# Patient Record
Sex: Female | Born: 1940 | Race: White | Hispanic: No | Marital: Single | Smoking: Former smoker
Health system: Southern US, Community
[De-identification: ages and names within clinical notes are randomized; demographics above are authoritative.]

## PROBLEM LIST (undated history)

## (undated) DIAGNOSIS — Z9889 Other specified postprocedural states: Secondary | ICD-10-CM

## (undated) DIAGNOSIS — T8859XA Other complications of anesthesia, initial encounter: Secondary | ICD-10-CM

## (undated) DIAGNOSIS — T4145XA Adverse effect of unspecified anesthetic, initial encounter: Secondary | ICD-10-CM

## (undated) DIAGNOSIS — E0590 Thyrotoxicosis, unspecified without thyrotoxic crisis or storm: Secondary | ICD-10-CM

## (undated) DIAGNOSIS — R112 Nausea with vomiting, unspecified: Secondary | ICD-10-CM

## (undated) DIAGNOSIS — Z87442 Personal history of urinary calculi: Secondary | ICD-10-CM

## (undated) DIAGNOSIS — M1990 Unspecified osteoarthritis, unspecified site: Secondary | ICD-10-CM

## (undated) DIAGNOSIS — I10 Essential (primary) hypertension: Secondary | ICD-10-CM

## (undated) DIAGNOSIS — R51 Headache: Secondary | ICD-10-CM

## (undated) HISTORY — DX: Unspecified osteoarthritis, unspecified site: M19.90

## (undated) HISTORY — PX: TONSILLECTOMY: SUR1361

---

## 2014-06-06 ENCOUNTER — Other Ambulatory Visit: Payer: Self-pay | Admitting: Geriatric Medicine

## 2014-06-06 DIAGNOSIS — Z1231 Encounter for screening mammogram for malignant neoplasm of breast: Secondary | ICD-10-CM

## 2014-06-12 ENCOUNTER — Ambulatory Visit
Admission: RE | Admit: 2014-06-12 | Discharge: 2014-06-12 | Disposition: A | Discharge status: Home / Self Care | Payer: BC Managed Care – PPO | Source: Ambulatory Visit | Attending: Geriatric Medicine | Admitting: Geriatric Medicine

## 2014-06-12 DIAGNOSIS — Z1231 Encounter for screening mammogram for malignant neoplasm of breast: Secondary | ICD-10-CM

## 2014-07-06 ENCOUNTER — Other Ambulatory Visit: Payer: Self-pay | Admitting: Gastroenterology

## 2014-07-12 ENCOUNTER — Encounter (HOSPITAL_COMMUNITY): Payer: Self-pay | Admitting: Pharmacy Technician

## 2014-07-20 ENCOUNTER — Encounter (HOSPITAL_COMMUNITY): Payer: Self-pay | Admitting: *Deleted

## 2014-07-31 ENCOUNTER — Encounter (HOSPITAL_COMMUNITY): Payer: BC Managed Care – PPO | Admitting: Anesthesiology

## 2014-07-31 ENCOUNTER — Encounter (HOSPITAL_COMMUNITY): Payer: Self-pay | Admitting: *Deleted

## 2014-07-31 ENCOUNTER — Encounter (HOSPITAL_COMMUNITY)
Admission: RE | Disposition: A | Discharge status: Home / Self Care | Payer: Self-pay | Source: Ambulatory Visit | Attending: Gastroenterology

## 2014-07-31 ENCOUNTER — Ambulatory Visit (HOSPITAL_COMMUNITY)
Admission: RE | Admit: 2014-07-31 | Discharge: 2014-07-31 | Disposition: A | Discharge status: Home / Self Care | Payer: BC Managed Care – PPO | Source: Ambulatory Visit | Attending: Gastroenterology | Admitting: Gastroenterology

## 2014-07-31 ENCOUNTER — Ambulatory Visit (HOSPITAL_COMMUNITY): Payer: BC Managed Care – PPO | Admitting: Anesthesiology

## 2014-07-31 DIAGNOSIS — Z87891 Personal history of nicotine dependence: Secondary | ICD-10-CM | POA: Diagnosis not present

## 2014-07-31 DIAGNOSIS — K5730 Diverticulosis of large intestine without perforation or abscess without bleeding: Secondary | ICD-10-CM | POA: Insufficient documentation

## 2014-07-31 DIAGNOSIS — E890 Postprocedural hypothyroidism: Secondary | ICD-10-CM | POA: Diagnosis not present

## 2014-07-31 DIAGNOSIS — I10 Essential (primary) hypertension: Secondary | ICD-10-CM | POA: Diagnosis not present

## 2014-07-31 DIAGNOSIS — M899 Disorder of bone, unspecified: Secondary | ICD-10-CM | POA: Insufficient documentation

## 2014-07-31 DIAGNOSIS — E7800 Pure hypercholesterolemia, unspecified: Secondary | ICD-10-CM | POA: Insufficient documentation

## 2014-07-31 DIAGNOSIS — E0500 Thyrotoxicosis with diffuse goiter without thyrotoxic crisis or storm: Secondary | ICD-10-CM | POA: Insufficient documentation

## 2014-07-31 DIAGNOSIS — Z1211 Encounter for screening for malignant neoplasm of colon: Secondary | ICD-10-CM | POA: Diagnosis not present

## 2014-07-31 DIAGNOSIS — M949 Disorder of cartilage, unspecified: Secondary | ICD-10-CM

## 2014-07-31 HISTORY — DX: Personal history of urinary calculi: Z87.442

## 2014-07-31 HISTORY — DX: Headache: R51

## 2014-07-31 HISTORY — DX: Essential (primary) hypertension: I10

## 2014-07-31 HISTORY — DX: Other specified postprocedural states: R11.2

## 2014-07-31 HISTORY — DX: Thyrotoxicosis, unspecified without thyrotoxic crisis or storm: E05.90

## 2014-07-31 HISTORY — DX: Other complications of anesthesia, initial encounter: T88.59XA

## 2014-07-31 HISTORY — DX: Other specified postprocedural states: Z98.890

## 2014-07-31 HISTORY — DX: Adverse effect of unspecified anesthetic, initial encounter: T41.45XA

## 2014-07-31 HISTORY — PX: COLONOSCOPY WITH PROPOFOL: SHX5780

## 2014-07-31 SURGERY — COLONOSCOPY WITH PROPOFOL
Anesthesia: Monitor Anesthesia Care

## 2014-07-31 MED ORDER — ONDANSETRON HCL 4 MG/2ML IJ SOLN
INTRAMUSCULAR | Status: DC | PRN
  Administered 2014-07-31: 4 mg via INTRAVENOUS

## 2014-07-31 MED ORDER — ONDANSETRON HCL 4 MG/2ML IJ SOLN
INTRAMUSCULAR | Status: AC
  Filled 2014-07-31: qty 2

## 2014-07-31 MED ORDER — PROPOFOL 10 MG/ML IV BOLUS
INTRAVENOUS | Status: AC
  Filled 2014-07-31: qty 20

## 2014-07-31 MED ORDER — DEXAMETHASONE SODIUM PHOSPHATE 10 MG/ML IJ SOLN
INTRAMUSCULAR | Status: AC
  Filled 2014-07-31: qty 1

## 2014-07-31 MED ORDER — LIDOCAINE HCL (CARDIAC) 20 MG/ML IV SOLN
INTRAVENOUS | Status: DC | PRN
  Administered 2014-07-31: 75 mg via INTRAVENOUS

## 2014-07-31 MED ORDER — SODIUM CHLORIDE 0.9 % IV SOLN: INTRAVENOUS | Status: DC

## 2014-07-31 MED ORDER — DEXAMETHASONE SODIUM PHOSPHATE 10 MG/ML IJ SOLN
INTRAMUSCULAR | Status: DC | PRN
  Administered 2014-07-31: 5 mg via INTRAVENOUS

## 2014-07-31 MED ORDER — LACTATED RINGERS IV SOLN
INTRAVENOUS | Status: DC
  Administered 2014-07-31: 1000 mL via INTRAVENOUS

## 2014-07-31 MED ORDER — PROPOFOL INFUSION 10 MG/ML OPTIME
INTRAVENOUS | Status: DC | PRN
  Administered 2014-07-31: 140 ug/kg/min via INTRAVENOUS

## 2014-07-31 MED ORDER — PROPOFOL 10 MG/ML IV BOLUS
INTRAVENOUS | Status: DC | PRN
  Administered 2014-07-31 (×3): 20 mg via INTRAVENOUS
  Administered 2014-07-31: 30 mg via INTRAVENOUS

## 2014-07-31 MED ORDER — LIDOCAINE HCL (CARDIAC) 20 MG/ML IV SOLN
INTRAVENOUS | Status: AC
  Filled 2014-07-31: qty 5

## 2014-07-31 SURGICAL SUPPLY — 21 items

## 2014-07-31 NOTE — Transfer of Care (Signed)
Immediate Anesthesia Transfer of Care Note  Patient: Kerry Wright  Procedure(s) Performed: Procedure(s): COLONOSCOPY WITH PROPOFOL (N/A)  Patient Location: PACU and Endoscopy Unit  Anesthesia Type:MAC  Level of Consciousness: awake, alert  and oriented  Airway & Oxygen Therapy: Patient Spontanous Breathing and Patient connected to face mask oxygen  Post-op Assessment: Report given to PACU RN, Post -op Vital signs reviewed and stable and Patient moving all extremities X 4  Post vital signs: Reviewed and stable  Complications: No apparent anesthesia complications

## 2014-07-31 NOTE — H&P (Signed)
  Procedure: Baseline screening colonoscopy  History: The patient is a 73 year old female born [birth year]. She is scheduled to undergo her first screening colonoscopy with polypectomy to prevent colon cancer.  Past medical and surgical history: Tonsillectomy. Graves' disease. Hypothyroidism post radioactive iodine therapy to the thyroid. Kidney stones. Hypercholesterolemia. Hypertension. Osteopenia.  Allergies: Sulfa. Penicillin. IV contrast dye.  Exam: The patient is alert and lying comfortably on the endoscopy stretcher. Lungs are clear to auscultation. Cardiac exam reveals a regular rhythm. Abdomen is soft and nontender to palpation  Plan: Proceed with baseline screening colonoscopy

## 2014-07-31 NOTE — Anesthesia Postprocedure Evaluation (Signed)
Anesthesia Post Note  Patient: Kerry Wright  Procedure(s) Performed: Procedure(s) (LRB): COLONOSCOPY WITH PROPOFOL (N/A)  Anesthesia type: MAC  Patient location: PACU  Post pain: Pain level controlled  Post assessment: Post-op Vital signs reviewed  Last Vitals: BP 154/73  Pulse 75  Temp(Src) 36.7 C (Oral)  Resp 13  Ht 5' (1.524 m)  Wt 230 lb (104.327 kg)  BMI 44.92 kg/m2  SpO2 100%  Post vital signs: Reviewed  Level of consciousness: awake  Complications: No apparent anesthesia complications

## 2014-07-31 NOTE — Op Note (Signed)
Procedure: Baseline screening colonoscopy. Negative family history for colon cancer.  Endoscopist: Danise Edge  Premedication: Propofol administered by anesthesia  Procedure: The patient was placed in the left lateral decubitus position. Anal inspection and digital rectal exam were normal. The Pentax pediatric colonoscope was introduced into the rectum and advanced to the cecum. A normal-appearing appendiceal orifice was identified. A normal-appearing ileocecal valve was identified. Colonic preparation for the exam today was good.  Rectum. Normal. Retroflexed view of the distal rectum normal  Sigmoid colon and descending colon. Left colonic diverticulosis  Splenic flexure. Normal  Transverse colon. Normal  Hepatic flexure. Normal  Ascending colon. Normal  Cecum and ileocecal valve. Normal  Assessment: Normal baseline screening colonoscopy.

## 2014-07-31 NOTE — Discharge Instructions (Addendum)
Colonoscopy, Care After °Refer to this sheet in the next few weeks. These instructions provide you with information on caring for yourself after your procedure. Your health care provider may also give you more specific instructions. Your treatment has been planned according to current medical practices, but problems sometimes occur. Call your health care provider if you have any problems or questions after your procedure. °WHAT TO EXPECT AFTER THE PROCEDURE  °After your procedure, it is typical to have the following: °· A small amount of blood in your stool. °· Moderate amounts of gas and mild abdominal cramping or bloating. °HOME CARE INSTRUCTIONS °· Do not drive, operate machinery, or sign important documents for 24 hours. °· You may shower and resume your regular physical activities, but move at a slower pace for the first 24 hours. °· Take frequent rest periods for the first 24 hours. °· Walk around or put a warm pack on your abdomen to help reduce abdominal cramping and bloating. °· Drink enough fluids to keep your urine clear or pale yellow. °· You may resume your normal diet as instructed by your health care provider. Avoid heavy or fried foods that are hard to digest. °· Avoid drinking alcohol for 24 hours or as instructed by your health care provider. °· Only take over-the-counter or prescription medicines as directed by your health care provider. °· If a tissue sample (biopsy) was taken during your procedure: °¨ Do not take aspirin or blood thinners for 7 days, or as instructed by your health care provider. °¨ Do not drink alcohol for 7 days, or as instructed by your health care provider. °¨ Eat soft foods for the first 24 hours. °SEEK MEDICAL CARE IF: °You have persistent spotting of blood in your stool 2-3 days after the procedure. °SEEK IMMEDIATE MEDICAL CARE IF: °· You have more than a small spotting of blood in your stool. °· You pass large blood clots in your stool. °· Your abdomen is swollen  (distended). °· You have nausea or vomiting. °· You have a fever. °· You have increasing abdominal pain that is not relieved with medicine. °Document Released: 06/17/2004 Document Revised: 08/24/2013 Document Reviewed: 07/11/2013 °Exit[employer]® Patient Information ©2015 [employer]. This information is not intended to replace advice given to you by your health care provider. Make sure you discuss any questions you have with your health care provider. ° °Colonoscopy, Care After °Refer to this sheet in the next few weeks. These instructions provide you with information on caring for yourself after your procedure. Your health care provider may also give you more specific instructions. Your treatment has been planned according to current medical practices, but problems sometimes occur. Call your health care provider if you have any problems or questions after your procedure. °WHAT TO EXPECT AFTER THE PROCEDURE  °After your procedure, it is typical to have the following: °· A small amount of blood in your stool. °· Moderate amounts of gas and mild abdominal cramping or bloating. °HOME CARE INSTRUCTIONS °· Do not drive, operate machinery, or sign important documents for 24 hours. °· You may shower and resume your regular physical activities, but move at a slower pace for the first 24 hours. °· Take frequent rest periods for the first 24 hours. °· Walk around or put a warm pack on your abdomen to help reduce abdominal cramping and bloating. °· Drink enough fluids to keep your urine clear or pale yellow. °· You may resume your normal diet as instructed by your health care provider.   Avoid heavy or fried foods that are hard to digest. °· Avoid drinking alcohol for 24 hours or as instructed by your health care provider. °· Only take over-the-counter or prescription medicines as directed by your health care provider. °· If a tissue sample (biopsy) was taken during your procedure: °¨ Do not take aspirin or blood thinners for 7  days, or as instructed by your health care provider. °¨ Do not drink alcohol for 7 days, or as instructed by your health care provider. °¨ Eat soft foods for the first 24 hours. °SEEK MEDICAL CARE IF: °You have persistent spotting of blood in your stool 2-3 days after the procedure. °SEEK IMMEDIATE MEDICAL CARE IF: °· You have more than a small spotting of blood in your stool. °· You pass large blood clots in your stool. °· Your abdomen is swollen (distended). °· You have nausea or vomiting. °· You have a fever. °· You have increasing abdominal pain that is not relieved with medicine. °Document Released: 06/17/2004 Document Revised: 08/24/2013 Document Reviewed: 07/11/2013 °Exit[employer]® Patient Information ©2015 [employer]. This information is not intended to replace advice given to you by your health care provider. Make sure you discuss any questions you have with your health care provider. ° °

## 2014-07-31 NOTE — Anesthesia Preprocedure Evaluation (Signed)
Anesthesia Evaluation  Patient identified by MRN, date of birth, ID band Patient awake    Reviewed: Allergy & Precautions, H&P , NPO status , Patient's Chart, lab work & pertinent test results  History of Anesthesia Complications (+) PONV and history of anesthetic complications  Airway Mallampati: II TM Distance: >3 FB Neck ROM: Full    Dental no notable dental hx.    Pulmonary former smoker,  breath sounds clear to auscultation  Pulmonary exam normal       Cardiovascular hypertension, Pt. on home beta blockers and Pt. on medications Rhythm:Regular Rate:Normal     Neuro/Psych  Headaches, negative psych ROS   GI/Hepatic negative GI ROS, Neg liver ROS,   Endo/Other  Hyperthyroidism   Renal/GU negative Renal ROS     Musculoskeletal negative musculoskeletal ROS (+)   Abdominal   Peds  Hematology negative hematology ROS (+)   Anesthesia Other Findings   Reproductive/Obstetrics negative OB ROS                           Anesthesia Physical Anesthesia Plan  ASA: II  Anesthesia Plan: MAC   Post-op Pain Management:    Induction: Intravenous  Airway Management Planned:   Additional Equipment:   Intra-op Plan:   Post-operative Plan:   Informed Consent: I have reviewed the patients History and Physical, chart, labs and discussed the procedure including the risks, benefits and alternatives for the proposed anesthesia with the patient or authorized representative who has indicated his/her understanding and acceptance.   Dental advisory given  Plan Discussed with: CRNA  Anesthesia Plan Comments:         Anesthesia Quick Evaluation

## 2014-08-01 ENCOUNTER — Encounter (HOSPITAL_COMMUNITY): Payer: Self-pay | Admitting: Gastroenterology

## 2015-10-24 ENCOUNTER — Other Ambulatory Visit: Payer: Self-pay | Admitting: Geriatric Medicine

## 2015-10-24 ENCOUNTER — Other Ambulatory Visit: Payer: Self-pay

## 2015-10-24 DIAGNOSIS — Z1231 Encounter for screening mammogram for malignant neoplasm of breast: Secondary | ICD-10-CM

## 2015-11-13 ENCOUNTER — Ambulatory Visit
Admission: RE | Admit: 2015-11-13 | Discharge: 2015-11-13 | Disposition: A | Discharge status: Home / Self Care | Payer: BLUE CROSS/BLUE SHIELD | Source: Ambulatory Visit | Attending: Geriatric Medicine | Admitting: Geriatric Medicine

## 2015-11-13 DIAGNOSIS — Z1231 Encounter for screening mammogram for malignant neoplasm of breast: Secondary | ICD-10-CM

## 2017-12-03 DIAGNOSIS — J209 Acute bronchitis, unspecified: Secondary | ICD-10-CM | POA: Diagnosis not present

## 2017-12-09 ENCOUNTER — Other Ambulatory Visit: Payer: Self-pay | Admitting: Geriatric Medicine

## 2017-12-09 DIAGNOSIS — Z79899 Other long term (current) drug therapy: Secondary | ICD-10-CM | POA: Diagnosis not present

## 2017-12-09 DIAGNOSIS — Z1231 Encounter for screening mammogram for malignant neoplasm of breast: Secondary | ICD-10-CM

## 2017-12-09 DIAGNOSIS — Z23 Encounter for immunization: Secondary | ICD-10-CM | POA: Diagnosis not present

## 2017-12-09 DIAGNOSIS — I10 Essential (primary) hypertension: Secondary | ICD-10-CM | POA: Diagnosis not present

## 2017-12-09 DIAGNOSIS — E7800 Pure hypercholesterolemia, unspecified: Secondary | ICD-10-CM | POA: Diagnosis not present

## 2017-12-09 DIAGNOSIS — E039 Hypothyroidism, unspecified: Secondary | ICD-10-CM | POA: Diagnosis not present

## 2017-12-09 DIAGNOSIS — E559 Vitamin D deficiency, unspecified: Secondary | ICD-10-CM | POA: Diagnosis not present

## 2017-12-09 DIAGNOSIS — Z0000 Encounter for general adult medical examination without abnormal findings: Secondary | ICD-10-CM | POA: Diagnosis not present

## 2017-12-14 DIAGNOSIS — E890 Postprocedural hypothyroidism: Secondary | ICD-10-CM | POA: Diagnosis not present

## 2017-12-14 DIAGNOSIS — E0500 Thyrotoxicosis with diffuse goiter without thyrotoxic crisis or storm: Secondary | ICD-10-CM | POA: Diagnosis not present

## 2017-12-14 DIAGNOSIS — M8580 Other specified disorders of bone density and structure, unspecified site: Secondary | ICD-10-CM | POA: Diagnosis not present

## 2017-12-14 DIAGNOSIS — R7303 Prediabetes: Secondary | ICD-10-CM | POA: Diagnosis not present

## 2017-12-29 ENCOUNTER — Ambulatory Visit
Admission: RE | Admit: 2017-12-29 | Discharge: 2017-12-29 | Disposition: A | Discharge status: Home / Self Care | Payer: BLUE CROSS/BLUE SHIELD | Source: Ambulatory Visit | Attending: Geriatric Medicine | Admitting: Geriatric Medicine

## 2017-12-29 DIAGNOSIS — Z1231 Encounter for screening mammogram for malignant neoplasm of breast: Secondary | ICD-10-CM

## 2018-06-16 DIAGNOSIS — H8113 Benign paroxysmal vertigo, bilateral: Secondary | ICD-10-CM | POA: Diagnosis not present

## 2018-06-16 DIAGNOSIS — I10 Essential (primary) hypertension: Secondary | ICD-10-CM | POA: Diagnosis not present

## 2018-06-16 DIAGNOSIS — R7303 Prediabetes: Secondary | ICD-10-CM | POA: Diagnosis not present

## 2018-06-16 DIAGNOSIS — M25551 Pain in right hip: Secondary | ICD-10-CM | POA: Diagnosis not present

## 2018-06-22 DIAGNOSIS — M1611 Unilateral primary osteoarthritis, right hip: Secondary | ICD-10-CM | POA: Diagnosis not present

## 2018-06-22 DIAGNOSIS — M47816 Spondylosis without myelopathy or radiculopathy, lumbar region: Secondary | ICD-10-CM | POA: Diagnosis not present

## 2018-06-29 DIAGNOSIS — M1611 Unilateral primary osteoarthritis, right hip: Secondary | ICD-10-CM | POA: Diagnosis not present

## 2019-01-17 DIAGNOSIS — E890 Postprocedural hypothyroidism: Secondary | ICD-10-CM | POA: Diagnosis not present

## 2019-01-17 DIAGNOSIS — R7303 Prediabetes: Secondary | ICD-10-CM | POA: Diagnosis not present

## 2019-01-21 DIAGNOSIS — Z79899 Other long term (current) drug therapy: Secondary | ICD-10-CM | POA: Diagnosis not present

## 2019-01-21 DIAGNOSIS — Z0000 Encounter for general adult medical examination without abnormal findings: Secondary | ICD-10-CM | POA: Diagnosis not present

## 2019-01-21 DIAGNOSIS — E7800 Pure hypercholesterolemia, unspecified: Secondary | ICD-10-CM | POA: Diagnosis not present

## 2019-02-02 DIAGNOSIS — M8580 Other specified disorders of bone density and structure, unspecified site: Secondary | ICD-10-CM | POA: Diagnosis not present

## 2019-02-02 DIAGNOSIS — E890 Postprocedural hypothyroidism: Secondary | ICD-10-CM | POA: Diagnosis not present

## 2019-02-02 DIAGNOSIS — E0500 Thyrotoxicosis with diffuse goiter without thyrotoxic crisis or storm: Secondary | ICD-10-CM | POA: Diagnosis not present

## 2019-02-02 DIAGNOSIS — R7303 Prediabetes: Secondary | ICD-10-CM | POA: Diagnosis not present

## 2019-04-29 ENCOUNTER — Other Ambulatory Visit: Payer: Self-pay | Admitting: Internal Medicine

## 2019-04-29 DIAGNOSIS — R5381 Other malaise: Secondary | ICD-10-CM

## 2019-05-03 ENCOUNTER — Other Ambulatory Visit: Payer: Self-pay | Admitting: Internal Medicine

## 2019-05-03 DIAGNOSIS — M810 Age-related osteoporosis without current pathological fracture: Secondary | ICD-10-CM

## 2019-07-18 ENCOUNTER — Other Ambulatory Visit: Payer: BLUE CROSS/BLUE SHIELD

## 2019-09-06 ENCOUNTER — Other Ambulatory Visit: Payer: Self-pay | Admitting: Geriatric Medicine

## 2019-09-06 DIAGNOSIS — Z1231 Encounter for screening mammogram for malignant neoplasm of breast: Secondary | ICD-10-CM

## 2019-10-25 DIAGNOSIS — E039 Hypothyroidism, unspecified: Secondary | ICD-10-CM | POA: Diagnosis not present

## 2019-10-25 DIAGNOSIS — I10 Essential (primary) hypertension: Secondary | ICD-10-CM | POA: Diagnosis not present

## 2019-10-25 DIAGNOSIS — Z87891 Personal history of nicotine dependence: Secondary | ICD-10-CM | POA: Diagnosis not present

## 2019-10-25 DIAGNOSIS — E785 Hyperlipidemia, unspecified: Secondary | ICD-10-CM | POA: Diagnosis not present

## 2019-10-25 DIAGNOSIS — I739 Peripheral vascular disease, unspecified: Secondary | ICD-10-CM | POA: Diagnosis not present

## 2019-10-25 DIAGNOSIS — Z880 Allergy status to penicillin: Secondary | ICD-10-CM | POA: Diagnosis not present

## 2019-10-25 DIAGNOSIS — J309 Allergic rhinitis, unspecified: Secondary | ICD-10-CM | POA: Diagnosis not present

## 2019-10-25 DIAGNOSIS — Z882 Allergy status to sulfonamides status: Secondary | ICD-10-CM | POA: Diagnosis not present

## 2019-10-25 DIAGNOSIS — Z888 Allergy status to other drugs, medicaments and biological substances status: Secondary | ICD-10-CM | POA: Diagnosis not present

## 2019-12-01 ENCOUNTER — Other Ambulatory Visit: Payer: Self-pay

## 2019-12-01 ENCOUNTER — Ambulatory Visit
Admission: RE | Admit: 2019-12-01 | Discharge: 2019-12-01 | Disposition: A | Discharge status: Home / Self Care | Payer: Medicare HMO | Source: Ambulatory Visit | Attending: Internal Medicine | Admitting: Internal Medicine

## 2019-12-01 ENCOUNTER — Ambulatory Visit
Admission: RE | Admit: 2019-12-01 | Discharge: 2019-12-01 | Disposition: A | Discharge status: Home / Self Care | Payer: Medicare HMO | Source: Ambulatory Visit | Attending: Geriatric Medicine | Admitting: Geriatric Medicine

## 2019-12-01 DIAGNOSIS — Z780 Asymptomatic menopausal state: Secondary | ICD-10-CM | POA: Diagnosis not present

## 2019-12-01 DIAGNOSIS — M810 Age-related osteoporosis without current pathological fracture: Secondary | ICD-10-CM

## 2019-12-01 DIAGNOSIS — Z1231 Encounter for screening mammogram for malignant neoplasm of breast: Secondary | ICD-10-CM

## 2019-12-01 DIAGNOSIS — M85832 Other specified disorders of bone density and structure, left forearm: Secondary | ICD-10-CM | POA: Diagnosis not present

## 2019-12-07 DIAGNOSIS — R69 Illness, unspecified: Secondary | ICD-10-CM | POA: Diagnosis not present

## 2019-12-09 ENCOUNTER — Ambulatory Visit: Payer: BLUE CROSS/BLUE SHIELD | Attending: Internal Medicine

## 2019-12-09 ENCOUNTER — Other Ambulatory Visit: Payer: Self-pay

## 2019-12-09 DIAGNOSIS — Z23 Encounter for immunization: Secondary | ICD-10-CM | POA: Insufficient documentation

## 2019-12-09 NOTE — Progress Notes (Signed)
   Covid-19 Vaccination Clinic  Name:  Kerry Wright    MRN: [id number] DOB: 23-Jan-1941  12/09/2019  Kerry Wright was observed post Covid-19 immunization for 30 minutes based on pre-vaccination screening without incidence. She was provided with Vaccine Information Sheet and instruction to access the V-Safe system.   Kerry Wright was instructed to call 911 with any severe reactions post vaccine: Marland Kitchen Difficulty breathing  . Swelling of your face and throat  . A fast heartbeat  . A bad rash all over your body  . Dizziness and weakness    Immunizations Administered    Name Date Dose VIS Date Route   Pfizer COVID-19 Vaccine 12/09/2019 10:40 AM 0.3 mL 10/28/2019 Intramuscular   Manufacturer: [employer], [employer]   Lot: [id number]   NDC: [address]-1

## 2019-12-28 ENCOUNTER — Ambulatory Visit: Payer: BLUE CROSS/BLUE SHIELD

## 2019-12-31 ENCOUNTER — Ambulatory Visit: Payer: BLUE CROSS/BLUE SHIELD | Attending: Internal Medicine

## 2019-12-31 DIAGNOSIS — Z23 Encounter for immunization: Secondary | ICD-10-CM | POA: Insufficient documentation

## 2019-12-31 NOTE — Progress Notes (Signed)
   Covid-19 Vaccination Clinic  Name:  Kerry Wright    MRN: [id number] DOB: December 04, 1940  12/31/2019  Kerry Wright was observed post Covid-19 immunization for 30 minutes based on pre-vaccination screening without incidence. She was provided with Vaccine Information Sheet and instruction to access the V-Safe system.   Kerry Wright was instructed to call 911 with any severe reactions post vaccine: Marland Kitchen Difficulty breathing  . Swelling of your face and throat  . A fast heartbeat  . A bad rash all over your body  . Dizziness and weakness    Immunizations Administered    Name Date Dose VIS Date Route   Pfizer COVID-19 Vaccine 12/31/2019 10:23 AM 0.3 mL 10/28/2019 Intramuscular   Manufacturer: [employer], [employer]   Lot: [id number]   NDC: [address]-9

## 2020-01-23 DIAGNOSIS — M25551 Pain in right hip: Secondary | ICD-10-CM | POA: Diagnosis not present

## 2020-01-23 DIAGNOSIS — R69 Illness, unspecified: Secondary | ICD-10-CM | POA: Diagnosis not present

## 2020-01-23 DIAGNOSIS — R7309 Other abnormal glucose: Secondary | ICD-10-CM | POA: Diagnosis not present

## 2020-01-23 DIAGNOSIS — I129 Hypertensive chronic kidney disease with stage 1 through stage 4 chronic kidney disease, or unspecified chronic kidney disease: Secondary | ICD-10-CM | POA: Diagnosis not present

## 2020-01-23 DIAGNOSIS — N1831 Chronic kidney disease, stage 3a: Secondary | ICD-10-CM | POA: Diagnosis not present

## 2020-01-23 DIAGNOSIS — M8580 Other specified disorders of bone density and structure, unspecified site: Secondary | ICD-10-CM | POA: Diagnosis not present

## 2020-01-23 DIAGNOSIS — J301 Allergic rhinitis due to pollen: Secondary | ICD-10-CM | POA: Diagnosis not present

## 2020-01-23 DIAGNOSIS — Z0000 Encounter for general adult medical examination without abnormal findings: Secondary | ICD-10-CM | POA: Diagnosis not present

## 2020-01-23 DIAGNOSIS — Z79899 Other long term (current) drug therapy: Secondary | ICD-10-CM | POA: Diagnosis not present

## 2020-01-23 DIAGNOSIS — E039 Hypothyroidism, unspecified: Secondary | ICD-10-CM | POA: Diagnosis not present

## 2020-01-23 DIAGNOSIS — E7800 Pure hypercholesterolemia, unspecified: Secondary | ICD-10-CM | POA: Diagnosis not present

## 2020-01-23 DIAGNOSIS — E890 Postprocedural hypothyroidism: Secondary | ICD-10-CM | POA: Diagnosis not present

## 2020-02-02 DIAGNOSIS — R7303 Prediabetes: Secondary | ICD-10-CM | POA: Diagnosis not present

## 2020-02-02 DIAGNOSIS — E0500 Thyrotoxicosis with diffuse goiter without thyrotoxic crisis or storm: Secondary | ICD-10-CM | POA: Diagnosis not present

## 2020-02-02 DIAGNOSIS — E890 Postprocedural hypothyroidism: Secondary | ICD-10-CM | POA: Diagnosis not present

## 2020-02-02 DIAGNOSIS — M8580 Other specified disorders of bone density and structure, unspecified site: Secondary | ICD-10-CM | POA: Diagnosis not present

## 2020-02-13 DIAGNOSIS — I129 Hypertensive chronic kidney disease with stage 1 through stage 4 chronic kidney disease, or unspecified chronic kidney disease: Secondary | ICD-10-CM | POA: Diagnosis not present

## 2020-02-13 DIAGNOSIS — R69 Illness, unspecified: Secondary | ICD-10-CM | POA: Diagnosis not present

## 2020-02-13 DIAGNOSIS — N1831 Chronic kidney disease, stage 3a: Secondary | ICD-10-CM | POA: Diagnosis not present

## 2020-03-12 DIAGNOSIS — Z79899 Other long term (current) drug therapy: Secondary | ICD-10-CM | POA: Diagnosis not present

## 2020-03-12 DIAGNOSIS — N1831 Chronic kidney disease, stage 3a: Secondary | ICD-10-CM | POA: Diagnosis not present

## 2020-03-12 DIAGNOSIS — I129 Hypertensive chronic kidney disease with stage 1 through stage 4 chronic kidney disease, or unspecified chronic kidney disease: Secondary | ICD-10-CM | POA: Diagnosis not present

## 2020-03-12 DIAGNOSIS — R69 Illness, unspecified: Secondary | ICD-10-CM | POA: Diagnosis not present

## 2020-05-09 DIAGNOSIS — N1831 Chronic kidney disease, stage 3a: Secondary | ICD-10-CM | POA: Diagnosis not present

## 2020-05-09 DIAGNOSIS — M1611 Unilateral primary osteoarthritis, right hip: Secondary | ICD-10-CM | POA: Diagnosis not present

## 2020-05-09 DIAGNOSIS — L821 Other seborrheic keratosis: Secondary | ICD-10-CM | POA: Diagnosis not present

## 2020-05-09 DIAGNOSIS — I129 Hypertensive chronic kidney disease with stage 1 through stage 4 chronic kidney disease, or unspecified chronic kidney disease: Secondary | ICD-10-CM | POA: Diagnosis not present

## 2020-05-09 DIAGNOSIS — Z6839 Body mass index (BMI) 39.0-39.9, adult: Secondary | ICD-10-CM | POA: Diagnosis not present

## 2020-06-20 DIAGNOSIS — R69 Illness, unspecified: Secondary | ICD-10-CM | POA: Diagnosis not present

## 2020-06-25 DIAGNOSIS — E7800 Pure hypercholesterolemia, unspecified: Secondary | ICD-10-CM | POA: Diagnosis not present

## 2020-06-25 DIAGNOSIS — R69 Illness, unspecified: Secondary | ICD-10-CM | POA: Diagnosis not present

## 2020-06-25 DIAGNOSIS — N1831 Chronic kidney disease, stage 3a: Secondary | ICD-10-CM | POA: Diagnosis not present

## 2020-06-25 DIAGNOSIS — E890 Postprocedural hypothyroidism: Secondary | ICD-10-CM | POA: Diagnosis not present

## 2020-06-25 DIAGNOSIS — I129 Hypertensive chronic kidney disease with stage 1 through stage 4 chronic kidney disease, or unspecified chronic kidney disease: Secondary | ICD-10-CM | POA: Diagnosis not present

## 2020-06-25 DIAGNOSIS — E039 Hypothyroidism, unspecified: Secondary | ICD-10-CM | POA: Diagnosis not present

## 2020-06-25 DIAGNOSIS — M1611 Unilateral primary osteoarthritis, right hip: Secondary | ICD-10-CM | POA: Diagnosis not present

## 2020-07-07 DIAGNOSIS — I10 Essential (primary) hypertension: Secondary | ICD-10-CM | POA: Diagnosis not present

## 2020-07-07 DIAGNOSIS — K5900 Constipation, unspecified: Secondary | ICD-10-CM | POA: Diagnosis not present

## 2020-07-07 DIAGNOSIS — Z809 Family history of malignant neoplasm, unspecified: Secondary | ICD-10-CM | POA: Diagnosis not present

## 2020-07-07 DIAGNOSIS — I739 Peripheral vascular disease, unspecified: Secondary | ICD-10-CM | POA: Diagnosis not present

## 2020-07-07 DIAGNOSIS — R69 Illness, unspecified: Secondary | ICD-10-CM | POA: Diagnosis not present

## 2020-07-07 DIAGNOSIS — R42 Dizziness and giddiness: Secondary | ICD-10-CM | POA: Diagnosis not present

## 2020-07-07 DIAGNOSIS — E785 Hyperlipidemia, unspecified: Secondary | ICD-10-CM | POA: Diagnosis not present

## 2020-07-07 DIAGNOSIS — M1990 Unspecified osteoarthritis, unspecified site: Secondary | ICD-10-CM | POA: Diagnosis not present

## 2020-07-07 DIAGNOSIS — E039 Hypothyroidism, unspecified: Secondary | ICD-10-CM | POA: Diagnosis not present

## 2020-08-27 DIAGNOSIS — E7800 Pure hypercholesterolemia, unspecified: Secondary | ICD-10-CM | POA: Diagnosis not present

## 2020-08-27 DIAGNOSIS — R69 Illness, unspecified: Secondary | ICD-10-CM | POA: Diagnosis not present

## 2020-08-27 DIAGNOSIS — E890 Postprocedural hypothyroidism: Secondary | ICD-10-CM | POA: Diagnosis not present

## 2020-08-27 DIAGNOSIS — E039 Hypothyroidism, unspecified: Secondary | ICD-10-CM | POA: Diagnosis not present

## 2020-08-27 DIAGNOSIS — M1611 Unilateral primary osteoarthritis, right hip: Secondary | ICD-10-CM | POA: Diagnosis not present

## 2020-08-27 DIAGNOSIS — I129 Hypertensive chronic kidney disease with stage 1 through stage 4 chronic kidney disease, or unspecified chronic kidney disease: Secondary | ICD-10-CM | POA: Diagnosis not present

## 2020-08-27 DIAGNOSIS — N1831 Chronic kidney disease, stage 3a: Secondary | ICD-10-CM | POA: Diagnosis not present

## 2020-09-18 DIAGNOSIS — R509 Fever, unspecified: Secondary | ICD-10-CM | POA: Diagnosis not present

## 2020-09-18 DIAGNOSIS — Z20822 Contact with and (suspected) exposure to covid-19: Secondary | ICD-10-CM | POA: Diagnosis not present

## 2020-10-08 DIAGNOSIS — Z23 Encounter for immunization: Secondary | ICD-10-CM | POA: Diagnosis not present

## 2020-10-08 DIAGNOSIS — N1831 Chronic kidney disease, stage 3a: Secondary | ICD-10-CM | POA: Diagnosis not present

## 2020-10-08 DIAGNOSIS — I129 Hypertensive chronic kidney disease with stage 1 through stage 4 chronic kidney disease, or unspecified chronic kidney disease: Secondary | ICD-10-CM | POA: Diagnosis not present

## 2020-12-24 DIAGNOSIS — E7800 Pure hypercholesterolemia, unspecified: Secondary | ICD-10-CM | POA: Diagnosis not present

## 2020-12-24 DIAGNOSIS — E890 Postprocedural hypothyroidism: Secondary | ICD-10-CM | POA: Diagnosis not present

## 2020-12-24 DIAGNOSIS — N1831 Chronic kidney disease, stage 3a: Secondary | ICD-10-CM | POA: Diagnosis not present

## 2020-12-24 DIAGNOSIS — R69 Illness, unspecified: Secondary | ICD-10-CM | POA: Diagnosis not present

## 2020-12-24 DIAGNOSIS — E039 Hypothyroidism, unspecified: Secondary | ICD-10-CM | POA: Diagnosis not present

## 2020-12-24 DIAGNOSIS — I129 Hypertensive chronic kidney disease with stage 1 through stage 4 chronic kidney disease, or unspecified chronic kidney disease: Secondary | ICD-10-CM | POA: Diagnosis not present

## 2020-12-24 DIAGNOSIS — M1611 Unilateral primary osteoarthritis, right hip: Secondary | ICD-10-CM | POA: Diagnosis not present

## 2021-01-23 DIAGNOSIS — Z0000 Encounter for general adult medical examination without abnormal findings: Secondary | ICD-10-CM | POA: Diagnosis not present

## 2021-01-23 DIAGNOSIS — I129 Hypertensive chronic kidney disease with stage 1 through stage 4 chronic kidney disease, or unspecified chronic kidney disease: Secondary | ICD-10-CM | POA: Diagnosis not present

## 2021-01-23 DIAGNOSIS — R7303 Prediabetes: Secondary | ICD-10-CM | POA: Diagnosis not present

## 2021-01-23 DIAGNOSIS — H25093 Other age-related incipient cataract, bilateral: Secondary | ICD-10-CM | POA: Diagnosis not present

## 2021-01-23 DIAGNOSIS — E890 Postprocedural hypothyroidism: Secondary | ICD-10-CM | POA: Diagnosis not present

## 2021-01-23 DIAGNOSIS — Z1389 Encounter for screening for other disorder: Secondary | ICD-10-CM | POA: Diagnosis not present

## 2021-01-23 DIAGNOSIS — Z1331 Encounter for screening for depression: Secondary | ICD-10-CM | POA: Diagnosis not present

## 2021-01-23 DIAGNOSIS — E7800 Pure hypercholesterolemia, unspecified: Secondary | ICD-10-CM | POA: Diagnosis not present

## 2021-01-23 DIAGNOSIS — K5901 Slow transit constipation: Secondary | ICD-10-CM | POA: Diagnosis not present

## 2021-01-23 DIAGNOSIS — Z79899 Other long term (current) drug therapy: Secondary | ICD-10-CM | POA: Diagnosis not present

## 2021-01-23 DIAGNOSIS — E0500 Thyrotoxicosis with diffuse goiter without thyrotoxic crisis or storm: Secondary | ICD-10-CM | POA: Diagnosis not present

## 2021-02-04 DIAGNOSIS — E890 Postprocedural hypothyroidism: Secondary | ICD-10-CM | POA: Diagnosis not present

## 2021-02-06 DIAGNOSIS — E0500 Thyrotoxicosis with diffuse goiter without thyrotoxic crisis or storm: Secondary | ICD-10-CM | POA: Diagnosis not present

## 2021-02-06 DIAGNOSIS — M8580 Other specified disorders of bone density and structure, unspecified site: Secondary | ICD-10-CM | POA: Diagnosis not present

## 2021-02-06 DIAGNOSIS — R7303 Prediabetes: Secondary | ICD-10-CM | POA: Diagnosis not present

## 2021-02-06 DIAGNOSIS — E890 Postprocedural hypothyroidism: Secondary | ICD-10-CM | POA: Diagnosis not present

## 2021-02-28 DIAGNOSIS — I10 Essential (primary) hypertension: Secondary | ICD-10-CM | POA: Diagnosis not present

## 2021-02-28 DIAGNOSIS — I739 Peripheral vascular disease, unspecified: Secondary | ICD-10-CM | POA: Diagnosis not present

## 2021-02-28 DIAGNOSIS — Z6841 Body Mass Index (BMI) 40.0 and over, adult: Secondary | ICD-10-CM | POA: Diagnosis not present

## 2021-02-28 DIAGNOSIS — J301 Allergic rhinitis due to pollen: Secondary | ICD-10-CM | POA: Diagnosis not present

## 2021-02-28 DIAGNOSIS — E785 Hyperlipidemia, unspecified: Secondary | ICD-10-CM | POA: Diagnosis not present

## 2021-02-28 DIAGNOSIS — E890 Postprocedural hypothyroidism: Secondary | ICD-10-CM | POA: Diagnosis not present

## 2021-02-28 DIAGNOSIS — R69 Illness, unspecified: Secondary | ICD-10-CM | POA: Diagnosis not present

## 2021-02-28 DIAGNOSIS — G8929 Other chronic pain: Secondary | ICD-10-CM | POA: Diagnosis not present

## 2021-04-01 DIAGNOSIS — M1611 Unilateral primary osteoarthritis, right hip: Secondary | ICD-10-CM | POA: Diagnosis not present

## 2021-04-01 DIAGNOSIS — E039 Hypothyroidism, unspecified: Secondary | ICD-10-CM | POA: Diagnosis not present

## 2021-04-01 DIAGNOSIS — E7800 Pure hypercholesterolemia, unspecified: Secondary | ICD-10-CM | POA: Diagnosis not present

## 2021-04-01 DIAGNOSIS — R69 Illness, unspecified: Secondary | ICD-10-CM | POA: Diagnosis not present

## 2021-04-01 DIAGNOSIS — E890 Postprocedural hypothyroidism: Secondary | ICD-10-CM | POA: Diagnosis not present

## 2021-04-01 DIAGNOSIS — N1831 Chronic kidney disease, stage 3a: Secondary | ICD-10-CM | POA: Diagnosis not present

## 2021-04-01 DIAGNOSIS — I129 Hypertensive chronic kidney disease with stage 1 through stage 4 chronic kidney disease, or unspecified chronic kidney disease: Secondary | ICD-10-CM | POA: Diagnosis not present

## 2021-04-01 DIAGNOSIS — H25093 Other age-related incipient cataract, bilateral: Secondary | ICD-10-CM | POA: Diagnosis not present

## 2021-05-10 DIAGNOSIS — R69 Illness, unspecified: Secondary | ICD-10-CM | POA: Diagnosis not present

## 2021-05-10 DIAGNOSIS — E890 Postprocedural hypothyroidism: Secondary | ICD-10-CM | POA: Diagnosis not present

## 2021-05-10 DIAGNOSIS — E7800 Pure hypercholesterolemia, unspecified: Secondary | ICD-10-CM | POA: Diagnosis not present

## 2021-05-10 DIAGNOSIS — I129 Hypertensive chronic kidney disease with stage 1 through stage 4 chronic kidney disease, or unspecified chronic kidney disease: Secondary | ICD-10-CM | POA: Diagnosis not present

## 2021-05-10 DIAGNOSIS — M1611 Unilateral primary osteoarthritis, right hip: Secondary | ICD-10-CM | POA: Diagnosis not present

## 2021-05-10 DIAGNOSIS — N1831 Chronic kidney disease, stage 3a: Secondary | ICD-10-CM | POA: Diagnosis not present

## 2021-05-10 DIAGNOSIS — H25093 Other age-related incipient cataract, bilateral: Secondary | ICD-10-CM | POA: Diagnosis not present

## 2021-05-10 DIAGNOSIS — E039 Hypothyroidism, unspecified: Secondary | ICD-10-CM | POA: Diagnosis not present

## 2021-06-10 DIAGNOSIS — H5213 Myopia, bilateral: Secondary | ICD-10-CM | POA: Diagnosis not present

## 2021-06-10 DIAGNOSIS — H18513 Endothelial corneal dystrophy, bilateral: Secondary | ICD-10-CM | POA: Diagnosis not present

## 2021-06-10 DIAGNOSIS — H1820 Unspecified corneal edema: Secondary | ICD-10-CM | POA: Diagnosis not present

## 2021-06-10 DIAGNOSIS — H2513 Age-related nuclear cataract, bilateral: Secondary | ICD-10-CM | POA: Diagnosis not present

## 2021-07-10 DIAGNOSIS — R69 Illness, unspecified: Secondary | ICD-10-CM | POA: Diagnosis not present

## 2021-07-10 DIAGNOSIS — N1831 Chronic kidney disease, stage 3a: Secondary | ICD-10-CM | POA: Diagnosis not present

## 2021-07-10 DIAGNOSIS — E7800 Pure hypercholesterolemia, unspecified: Secondary | ICD-10-CM | POA: Diagnosis not present

## 2021-07-10 DIAGNOSIS — E890 Postprocedural hypothyroidism: Secondary | ICD-10-CM | POA: Diagnosis not present

## 2021-07-10 DIAGNOSIS — E039 Hypothyroidism, unspecified: Secondary | ICD-10-CM | POA: Diagnosis not present

## 2021-07-10 DIAGNOSIS — I129 Hypertensive chronic kidney disease with stage 1 through stage 4 chronic kidney disease, or unspecified chronic kidney disease: Secondary | ICD-10-CM | POA: Diagnosis not present

## 2021-07-10 DIAGNOSIS — M1611 Unilateral primary osteoarthritis, right hip: Secondary | ICD-10-CM | POA: Diagnosis not present

## 2021-07-10 DIAGNOSIS — H25093 Other age-related incipient cataract, bilateral: Secondary | ICD-10-CM | POA: Diagnosis not present

## 2021-10-16 DIAGNOSIS — H18513 Endothelial corneal dystrophy, bilateral: Secondary | ICD-10-CM | POA: Diagnosis not present

## 2021-10-16 DIAGNOSIS — H2513 Age-related nuclear cataract, bilateral: Secondary | ICD-10-CM | POA: Diagnosis not present

## 2021-10-16 DIAGNOSIS — H40003 Preglaucoma, unspecified, bilateral: Secondary | ICD-10-CM | POA: Diagnosis not present

## 2021-11-04 DIAGNOSIS — H401134 Primary open-angle glaucoma, bilateral, indeterminate stage: Secondary | ICD-10-CM | POA: Diagnosis not present

## 2021-11-04 DIAGNOSIS — N3000 Acute cystitis without hematuria: Secondary | ICD-10-CM | POA: Diagnosis not present

## 2021-12-04 IMAGING — MG DIGITAL SCREENING BILAT W/ TOMO W/ CAD
8 series · 8 of 24 positions shown · non-contrast
Comparison: None.

ACR Breast Density Category a: The breast tissue is almost entirely
fatty.

CLINICAL DATA: Screening.

EXAM:
DIGITAL SCREENING BILATERAL MAMMOGRAM WITH TOMO AND CAD

[R MLO synth-2D]
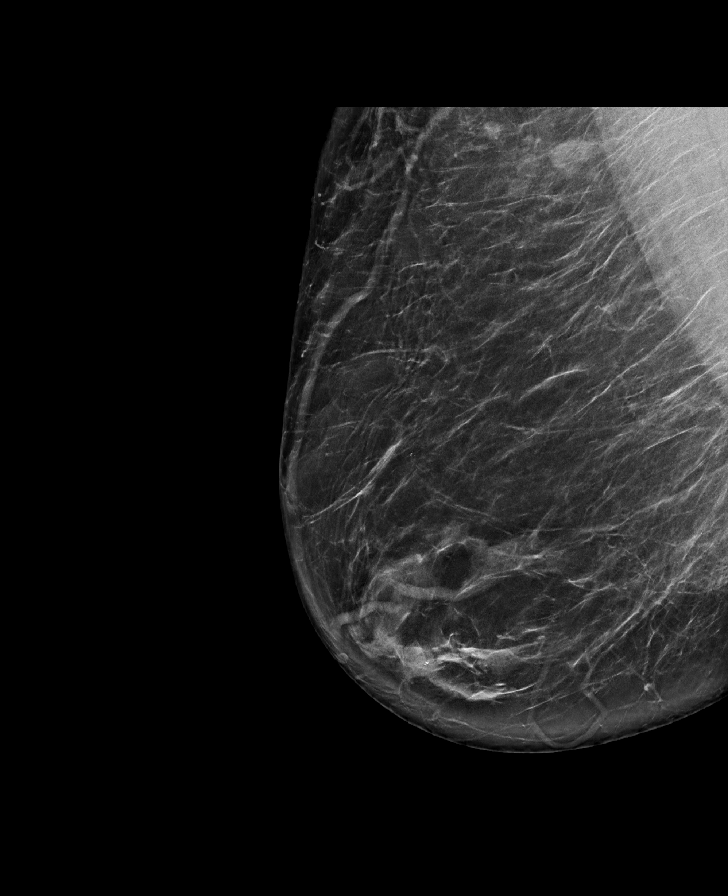

[L MLO synth-2D]
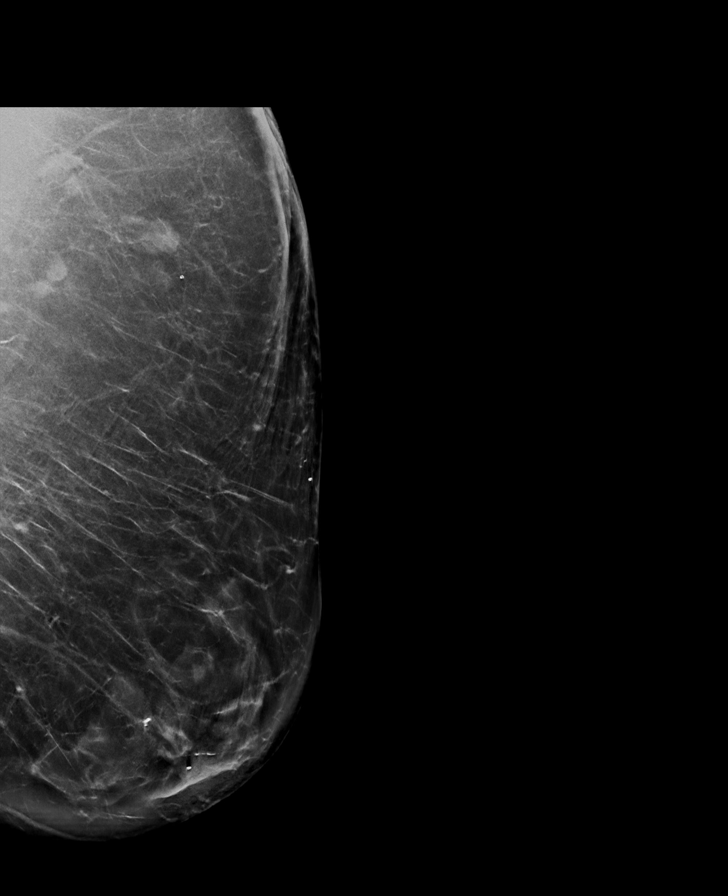

[R CC synth-2D]
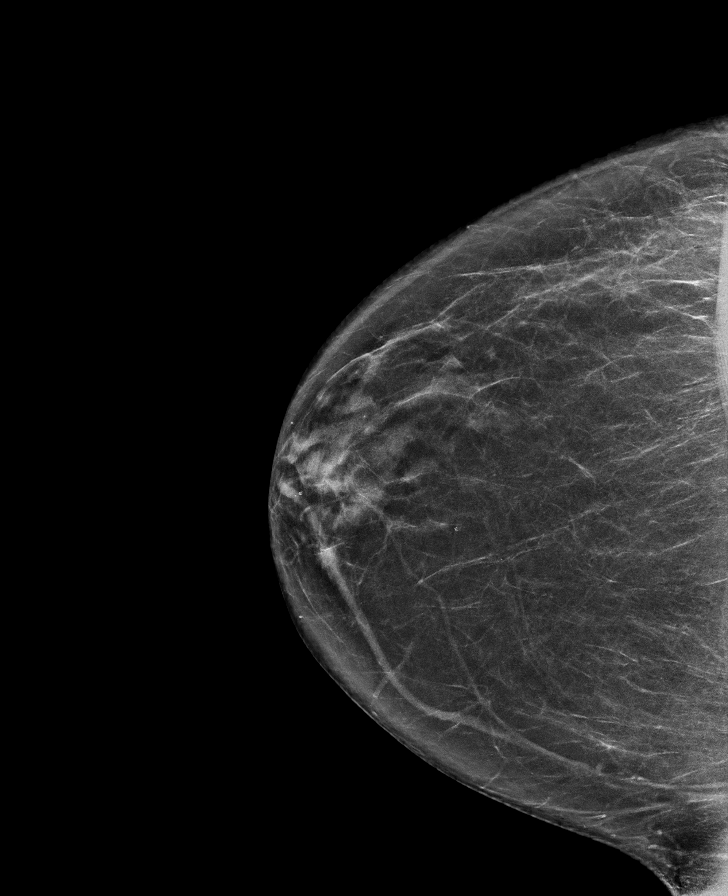

[L CC synth-2D]
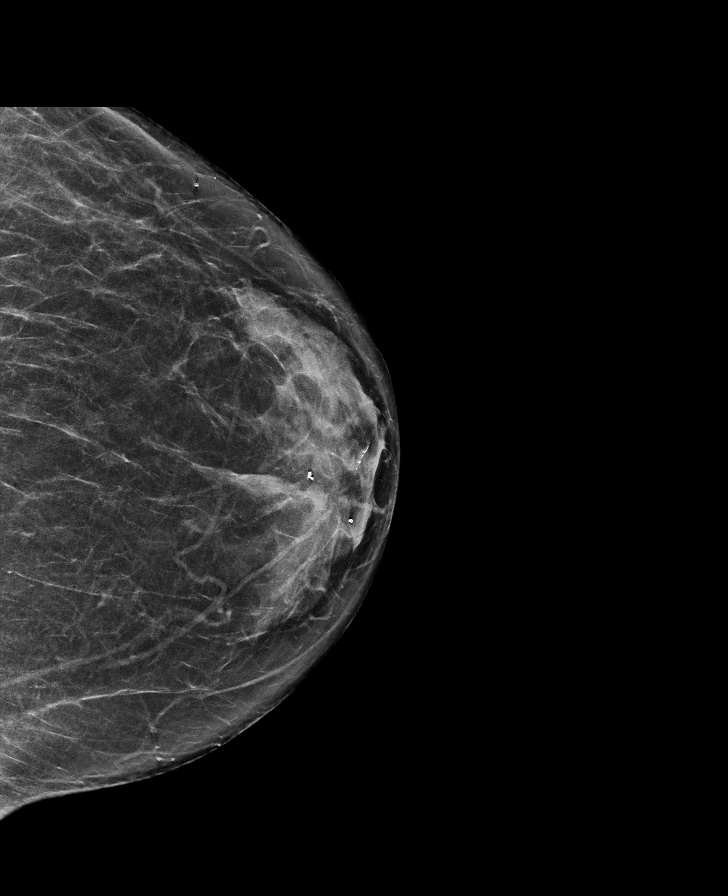

[L MLO tomo · tomo slice 46/91.0]
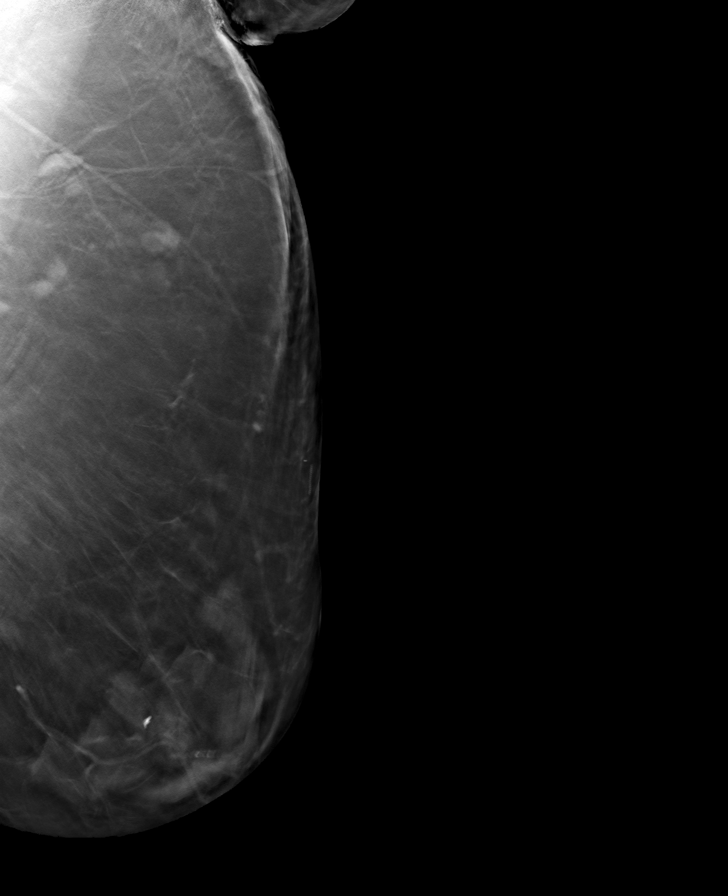

[R MLO tomo · tomo slice 40/79.0]
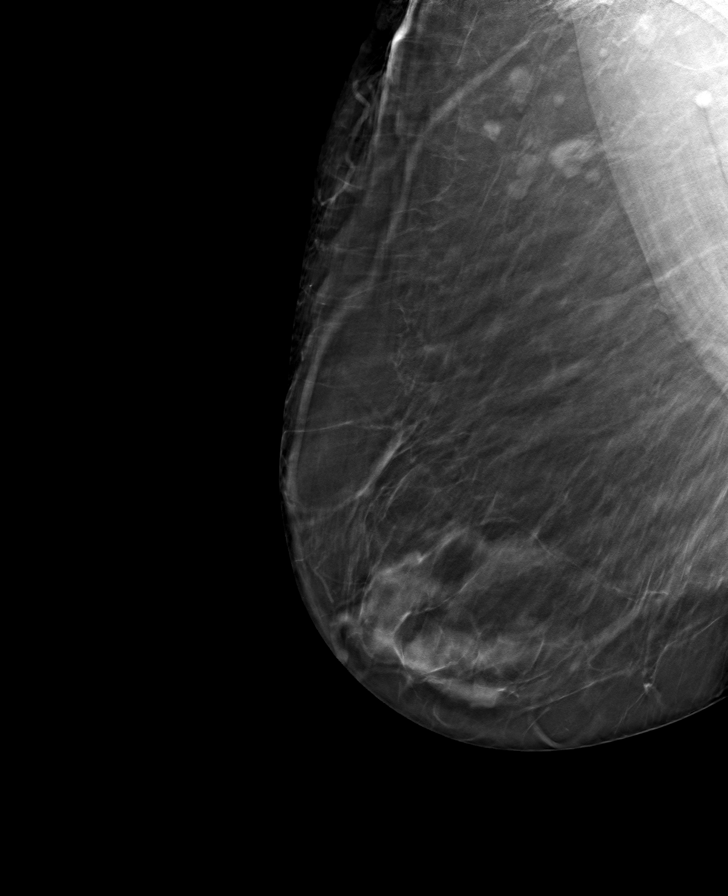

[R CC tomo · tomo slice 36/71.0]
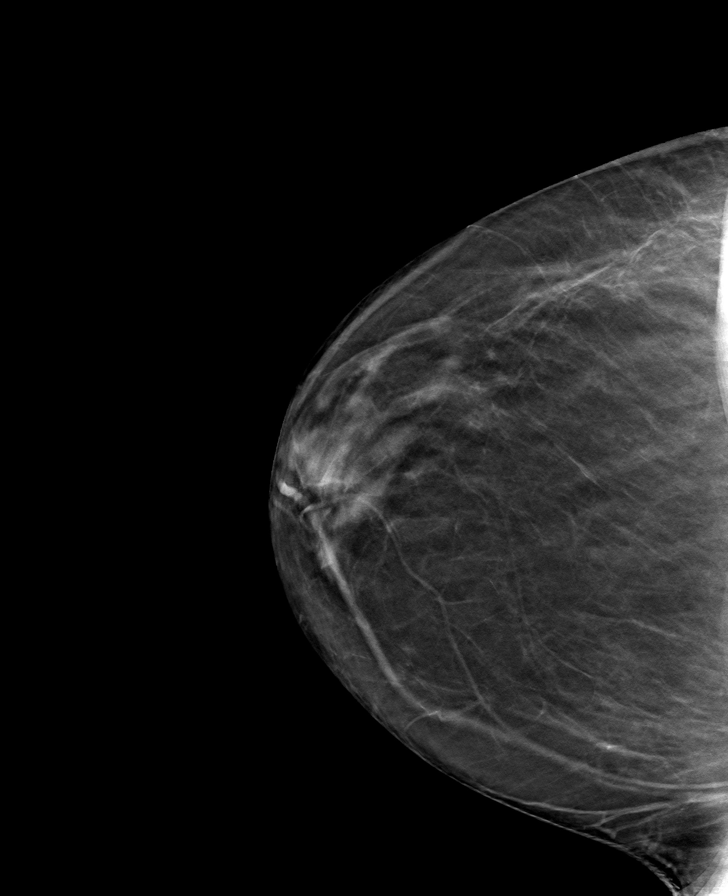

[L CC tomo · tomo slice 35/69.0]
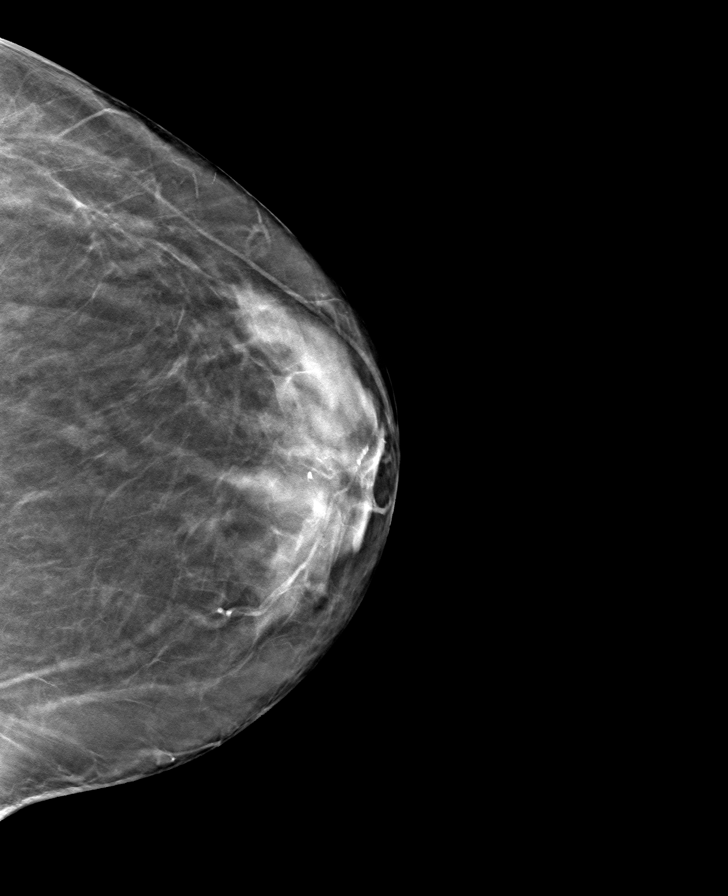

[8 of 24 positions shown; findings below may reference images not displayed]

FINDINGS: There are no findings suspicious for malignancy. Images were
processed with CAD.
IMPRESSION: No mammographic evidence of malignancy. A result letter of this
screening mammogram will be mailed directly to the patient.

RECOMMENDATION:
Screening mammogram in one year. (Code:[id number])

BI-RADS CATEGORY  1: Negative.

## 2021-12-12 DIAGNOSIS — H401134 Primary open-angle glaucoma, bilateral, indeterminate stage: Secondary | ICD-10-CM | POA: Diagnosis not present

## 2022-02-03 DIAGNOSIS — H401134 Primary open-angle glaucoma, bilateral, indeterminate stage: Secondary | ICD-10-CM | POA: Diagnosis not present

## 2022-02-05 DIAGNOSIS — N1831 Chronic kidney disease, stage 3a: Secondary | ICD-10-CM | POA: Diagnosis not present

## 2022-02-05 DIAGNOSIS — I129 Hypertensive chronic kidney disease with stage 1 through stage 4 chronic kidney disease, or unspecified chronic kidney disease: Secondary | ICD-10-CM | POA: Diagnosis not present

## 2022-02-05 DIAGNOSIS — R7303 Prediabetes: Secondary | ICD-10-CM | POA: Diagnosis not present

## 2022-02-05 DIAGNOSIS — Z79899 Other long term (current) drug therapy: Secondary | ICD-10-CM | POA: Diagnosis not present

## 2022-02-05 DIAGNOSIS — R300 Dysuria: Secondary | ICD-10-CM | POA: Diagnosis not present

## 2022-02-05 DIAGNOSIS — R7309 Other abnormal glucose: Secondary | ICD-10-CM | POA: Diagnosis not present

## 2022-02-05 DIAGNOSIS — E039 Hypothyroidism, unspecified: Secondary | ICD-10-CM | POA: Diagnosis not present

## 2022-02-05 DIAGNOSIS — M8580 Other specified disorders of bone density and structure, unspecified site: Secondary | ICD-10-CM | POA: Diagnosis not present

## 2022-02-05 DIAGNOSIS — E0500 Thyrotoxicosis with diffuse goiter without thyrotoxic crisis or storm: Secondary | ICD-10-CM | POA: Diagnosis not present

## 2022-02-05 DIAGNOSIS — E7800 Pure hypercholesterolemia, unspecified: Secondary | ICD-10-CM | POA: Diagnosis not present

## 2022-02-05 DIAGNOSIS — N3281 Overactive bladder: Secondary | ICD-10-CM | POA: Diagnosis not present

## 2022-02-05 DIAGNOSIS — E890 Postprocedural hypothyroidism: Secondary | ICD-10-CM | POA: Diagnosis not present

## 2022-02-05 DIAGNOSIS — Z0000 Encounter for general adult medical examination without abnormal findings: Secondary | ICD-10-CM | POA: Diagnosis not present

## 2022-03-10 DIAGNOSIS — N1831 Chronic kidney disease, stage 3a: Secondary | ICD-10-CM | POA: Diagnosis not present

## 2022-04-09 DIAGNOSIS — E890 Postprocedural hypothyroidism: Secondary | ICD-10-CM | POA: Diagnosis not present

## 2022-04-09 DIAGNOSIS — I129 Hypertensive chronic kidney disease with stage 1 through stage 4 chronic kidney disease, or unspecified chronic kidney disease: Secondary | ICD-10-CM | POA: Diagnosis not present

## 2022-04-30 DIAGNOSIS — H18513 Endothelial corneal dystrophy, bilateral: Secondary | ICD-10-CM | POA: Diagnosis not present

## 2022-04-30 DIAGNOSIS — H401134 Primary open-angle glaucoma, bilateral, indeterminate stage: Secondary | ICD-10-CM | POA: Diagnosis not present

## 2022-04-30 DIAGNOSIS — H2513 Age-related nuclear cataract, bilateral: Secondary | ICD-10-CM | POA: Diagnosis not present

## 2022-06-04 DIAGNOSIS — E890 Postprocedural hypothyroidism: Secondary | ICD-10-CM | POA: Diagnosis not present

## 2022-07-07 DIAGNOSIS — H401134 Primary open-angle glaucoma, bilateral, indeterminate stage: Secondary | ICD-10-CM | POA: Diagnosis not present

## 2022-07-14 DIAGNOSIS — H18511 Endothelial corneal dystrophy, right eye: Secondary | ICD-10-CM | POA: Diagnosis not present

## 2022-07-14 DIAGNOSIS — H2511 Age-related nuclear cataract, right eye: Secondary | ICD-10-CM | POA: Diagnosis not present

## 2022-07-17 DIAGNOSIS — E890 Postprocedural hypothyroidism: Secondary | ICD-10-CM | POA: Diagnosis not present

## 2022-07-24 DIAGNOSIS — H2511 Age-related nuclear cataract, right eye: Secondary | ICD-10-CM | POA: Diagnosis not present

## 2022-07-24 DIAGNOSIS — H18511 Endothelial corneal dystrophy, right eye: Secondary | ICD-10-CM | POA: Diagnosis not present

## 2022-07-24 DIAGNOSIS — H18512 Endothelial corneal dystrophy, left eye: Secondary | ICD-10-CM | POA: Diagnosis not present

## 2022-07-24 DIAGNOSIS — H25812 Combined forms of age-related cataract, left eye: Secondary | ICD-10-CM | POA: Diagnosis not present

## 2022-07-28 DIAGNOSIS — Z48810 Encounter for surgical aftercare following surgery on the sense organs: Secondary | ICD-10-CM | POA: Diagnosis not present

## 2022-07-28 DIAGNOSIS — Z947 Corneal transplant status: Secondary | ICD-10-CM | POA: Diagnosis not present

## 2022-07-28 DIAGNOSIS — H401134 Primary open-angle glaucoma, bilateral, indeterminate stage: Secondary | ICD-10-CM | POA: Diagnosis not present

## 2022-07-28 DIAGNOSIS — H2512 Age-related nuclear cataract, left eye: Secondary | ICD-10-CM | POA: Diagnosis not present

## 2022-07-28 DIAGNOSIS — H18512 Endothelial corneal dystrophy, left eye: Secondary | ICD-10-CM | POA: Diagnosis not present

## 2022-07-28 DIAGNOSIS — Z961 Presence of intraocular lens: Secondary | ICD-10-CM | POA: Diagnosis not present

## 2022-07-28 DIAGNOSIS — Z79899 Other long term (current) drug therapy: Secondary | ICD-10-CM | POA: Diagnosis not present

## 2022-07-28 DIAGNOSIS — E0500 Thyrotoxicosis with diffuse goiter without thyrotoxic crisis or storm: Secondary | ICD-10-CM | POA: Diagnosis not present

## 2022-08-01 DIAGNOSIS — Z48810 Encounter for surgical aftercare following surgery on the sense organs: Secondary | ICD-10-CM | POA: Diagnosis not present

## 2022-08-01 DIAGNOSIS — H02401 Unspecified ptosis of right eyelid: Secondary | ICD-10-CM | POA: Diagnosis not present

## 2022-08-01 DIAGNOSIS — Z947 Corneal transplant status: Secondary | ICD-10-CM | POA: Diagnosis not present

## 2022-08-01 DIAGNOSIS — Z888 Allergy status to other drugs, medicaments and biological substances status: Secondary | ICD-10-CM | POA: Diagnosis not present

## 2022-08-01 DIAGNOSIS — Z961 Presence of intraocular lens: Secondary | ICD-10-CM | POA: Diagnosis not present

## 2022-08-01 DIAGNOSIS — H2512 Age-related nuclear cataract, left eye: Secondary | ICD-10-CM | POA: Diagnosis not present

## 2022-08-01 DIAGNOSIS — Z882 Allergy status to sulfonamides status: Secondary | ICD-10-CM | POA: Diagnosis not present

## 2022-08-01 DIAGNOSIS — Z91041 Radiographic dye allergy status: Secondary | ICD-10-CM | POA: Diagnosis not present

## 2022-08-01 DIAGNOSIS — Z880 Allergy status to penicillin: Secondary | ICD-10-CM | POA: Diagnosis not present

## 2022-08-01 DIAGNOSIS — H18512 Endothelial corneal dystrophy, left eye: Secondary | ICD-10-CM | POA: Diagnosis not present

## 2022-08-01 DIAGNOSIS — H401134 Primary open-angle glaucoma, bilateral, indeterminate stage: Secondary | ICD-10-CM | POA: Diagnosis not present

## 2022-08-15 DIAGNOSIS — R300 Dysuria: Secondary | ICD-10-CM | POA: Diagnosis not present

## 2022-09-30 DIAGNOSIS — M4800 Spinal stenosis, site unspecified: Secondary | ICD-10-CM | POA: Diagnosis not present

## 2022-09-30 DIAGNOSIS — I739 Peripheral vascular disease, unspecified: Secondary | ICD-10-CM | POA: Diagnosis not present

## 2022-09-30 DIAGNOSIS — I10 Essential (primary) hypertension: Secondary | ICD-10-CM | POA: Diagnosis not present

## 2022-09-30 DIAGNOSIS — R32 Unspecified urinary incontinence: Secondary | ICD-10-CM | POA: Diagnosis not present

## 2022-09-30 DIAGNOSIS — E890 Postprocedural hypothyroidism: Secondary | ICD-10-CM | POA: Diagnosis not present

## 2022-09-30 DIAGNOSIS — M810 Age-related osteoporosis without current pathological fracture: Secondary | ICD-10-CM | POA: Diagnosis not present

## 2022-09-30 DIAGNOSIS — M1990 Unspecified osteoarthritis, unspecified site: Secondary | ICD-10-CM | POA: Diagnosis not present

## 2022-09-30 DIAGNOSIS — H409 Unspecified glaucoma: Secondary | ICD-10-CM | POA: Diagnosis not present

## 2022-09-30 DIAGNOSIS — E785 Hyperlipidemia, unspecified: Secondary | ICD-10-CM | POA: Diagnosis not present

## 2022-09-30 DIAGNOSIS — K5900 Constipation, unspecified: Secondary | ICD-10-CM | POA: Diagnosis not present

## 2022-09-30 DIAGNOSIS — F411 Generalized anxiety disorder: Secondary | ICD-10-CM | POA: Diagnosis not present

## 2022-09-30 DIAGNOSIS — R69 Illness, unspecified: Secondary | ICD-10-CM | POA: Diagnosis not present

## 2022-09-30 DIAGNOSIS — F325 Major depressive disorder, single episode, in full remission: Secondary | ICD-10-CM | POA: Diagnosis not present

## 2022-11-27 DIAGNOSIS — Z947 Corneal transplant status: Secondary | ICD-10-CM | POA: Diagnosis not present

## 2022-11-27 DIAGNOSIS — E039 Hypothyroidism, unspecified: Secondary | ICD-10-CM | POA: Diagnosis not present

## 2022-11-27 DIAGNOSIS — I10 Essential (primary) hypertension: Secondary | ICD-10-CM | POA: Diagnosis not present

## 2022-11-27 DIAGNOSIS — H18512 Endothelial corneal dystrophy, left eye: Secondary | ICD-10-CM | POA: Diagnosis not present

## 2022-11-27 DIAGNOSIS — H2512 Age-related nuclear cataract, left eye: Secondary | ICD-10-CM | POA: Diagnosis not present

## 2022-12-03 DIAGNOSIS — Z961 Presence of intraocular lens: Secondary | ICD-10-CM | POA: Diagnosis not present

## 2022-12-03 DIAGNOSIS — Z947 Corneal transplant status: Secondary | ICD-10-CM | POA: Diagnosis not present

## 2022-12-03 DIAGNOSIS — Z48298 Encounter for aftercare following other organ transplant: Secondary | ICD-10-CM | POA: Diagnosis not present

## 2022-12-03 DIAGNOSIS — H401130 Primary open-angle glaucoma, bilateral, stage unspecified: Secondary | ICD-10-CM | POA: Diagnosis not present

## 2022-12-03 DIAGNOSIS — Z48810 Encounter for surgical aftercare following surgery on the sense organs: Secondary | ICD-10-CM | POA: Diagnosis not present

## 2022-12-03 DIAGNOSIS — E0500 Thyrotoxicosis with diffuse goiter without thyrotoxic crisis or storm: Secondary | ICD-10-CM | POA: Diagnosis not present

## 2022-12-22 DIAGNOSIS — H401134 Primary open-angle glaucoma, bilateral, indeterminate stage: Secondary | ICD-10-CM | POA: Diagnosis not present

## 2023-01-21 DIAGNOSIS — Z947 Corneal transplant status: Secondary | ICD-10-CM | POA: Diagnosis not present

## 2023-01-21 DIAGNOSIS — Z9842 Cataract extraction status, left eye: Secondary | ICD-10-CM | POA: Diagnosis not present

## 2023-01-21 DIAGNOSIS — H401134 Primary open-angle glaucoma, bilateral, indeterminate stage: Secondary | ICD-10-CM | POA: Diagnosis not present

## 2023-01-21 DIAGNOSIS — Z961 Presence of intraocular lens: Secondary | ICD-10-CM | POA: Diagnosis not present

## 2023-01-25 ENCOUNTER — Emergency Department (HOSPITAL_COMMUNITY)
Admission: EM | Admit: 2023-01-25 | Discharge: 2023-01-25 | Disposition: A | Discharge status: Home / Self Care | Payer: Medicare HMO | Attending: Emergency Medicine | Admitting: Emergency Medicine

## 2023-01-25 ENCOUNTER — Other Ambulatory Visit: Payer: Self-pay

## 2023-01-25 ENCOUNTER — Encounter (HOSPITAL_COMMUNITY): Payer: Self-pay

## 2023-01-25 DIAGNOSIS — I10 Essential (primary) hypertension: Secondary | ICD-10-CM | POA: Insufficient documentation

## 2023-01-25 DIAGNOSIS — R55 Syncope and collapse: Secondary | ICD-10-CM | POA: Diagnosis not present

## 2023-01-25 DIAGNOSIS — Z743 Need for continuous supervision: Secondary | ICD-10-CM | POA: Diagnosis not present

## 2023-01-25 DIAGNOSIS — N390 Urinary tract infection, site not specified: Secondary | ICD-10-CM | POA: Insufficient documentation

## 2023-01-25 DIAGNOSIS — E039 Hypothyroidism, unspecified: Secondary | ICD-10-CM | POA: Diagnosis not present

## 2023-01-25 DIAGNOSIS — Z79899 Other long term (current) drug therapy: Secondary | ICD-10-CM | POA: Insufficient documentation

## 2023-01-25 DIAGNOSIS — Z7982 Long term (current) use of aspirin: Secondary | ICD-10-CM | POA: Insufficient documentation

## 2023-01-25 DIAGNOSIS — R42 Dizziness and giddiness: Secondary | ICD-10-CM | POA: Diagnosis not present

## 2023-01-25 DIAGNOSIS — R0902 Hypoxemia: Secondary | ICD-10-CM | POA: Diagnosis not present

## 2023-01-25 DIAGNOSIS — R531 Weakness: Secondary | ICD-10-CM | POA: Diagnosis not present

## 2023-01-25 LAB — URINALYSIS, ROUTINE W REFLEX MICROSCOPIC
Bilirubin Urine: NEGATIVE
Glucose, UA: NEGATIVE mg/dL
Ketones, ur: NEGATIVE mg/dL
Nitrite: POSITIVE — AB
Protein, ur: 30 mg/dL — AB
Specific Gravity, Urine: 1.012 (ref 1.005–1.030)
WBC, UA: >50 WBC/hpf (ref 0–5)
pH: 5 (ref 5.0–8.0)

## 2023-01-25 LAB — COMPREHENSIVE METABOLIC PANEL
ALT: 16 U/L (ref 0–44)
AST: 28 U/L (ref 15–41)
Albumin: 3.7 g/dL (ref 3.5–5.0)
Alkaline Phosphatase: 69 U/L (ref 38–126)
Anion gap: 6 (ref 5–15)
BUN: 37 mg/dL — ABNORMAL HIGH (ref 8–23)
CO2: 25 mmol/L (ref 22–32)
Calcium: 8.8 mg/dL — ABNORMAL LOW (ref 8.9–10.3)
Chloride: 105 mmol/L (ref 98–111)
Creatinine, Ser: 0.97 mg/dL (ref 0.44–1.00)
GFR, Estimated: 59 mL/min — ABNORMAL LOW (ref >60)
Glucose, Bld: 140 mg/dL — ABNORMAL HIGH (ref 70–99)
Potassium: 3.9 mmol/L (ref 3.5–5.1)
Sodium: 136 mmol/L (ref 135–145)
Total Bilirubin: 0.6 mg/dL (ref 0.3–1.2)
Total Protein: 6.3 g/dL — ABNORMAL LOW (ref 6.5–8.1)

## 2023-01-25 LAB — CBC WITH DIFFERENTIAL/PLATELET
Abs Immature Granulocytes: 0.02 10*3/uL (ref 0.00–0.07)
Basophils Absolute: 0.1 10*3/uL (ref 0.0–0.1)
Basophils Relative: 1 %
Eosinophils Absolute: 0.2 10*3/uL (ref 0.0–0.5)
Eosinophils Relative: 3 %
HCT: 36.2 % (ref 36.0–46.0)
Hemoglobin: 11.6 g/dL — ABNORMAL LOW (ref 12.0–15.0)
Immature Granulocytes: 0 %
Lymphocytes Relative: 38 %
Lymphs Abs: 2.3 10*3/uL (ref 0.7–4.0)
MCH: 31.1 pg (ref 26.0–34.0)
MCHC: 32 g/dL (ref 30.0–36.0)
MCV: 97.1 fL (ref 80.0–100.0)
Monocytes Absolute: 0.7 10*3/uL (ref 0.1–1.0)
Monocytes Relative: 11 %
Neutro Abs: 2.9 10*3/uL (ref 1.7–7.7)
Neutrophils Relative %: 47 %
Platelets: 197 10*3/uL (ref 150–400)
RBC: 3.73 MIL/uL — ABNORMAL LOW (ref 3.87–5.11)
RDW: 14.3 % (ref 11.5–15.5)
WBC: 6.1 10*3/uL (ref 4.0–10.5)
nRBC: 0 % (ref 0.0–0.2)

## 2023-01-25 LAB — LIPASE, BLOOD: Lipase: 47 U/L (ref 11–51)

## 2023-01-25 LAB — CBG MONITORING, ED: Glucose-Capillary: 116 mg/dL — ABNORMAL HIGH (ref 70–99)

## 2023-01-25 LAB — TSH: TSH: 3.333 u[IU]/mL (ref 0.350–4.500)

## 2023-01-25 MED ORDER — SODIUM CHLORIDE 0.9 % IV BOLUS
500.0000 mL | Freq: Once | INTRAVENOUS | Status: AC
  Administered 2023-01-25: 500 mL via INTRAVENOUS

## 2023-01-25 MED ORDER — NITROFURANTOIN MONOHYD MACRO 100 MG PO CAPS: 100.0000 mg | ORAL_CAPSULE | Freq: Two times a day (BID) | ORAL | 0 refills | Status: AC

## 2023-01-25 NOTE — ED Triage Notes (Signed)
Pt bib ems from church c/o near syncope. Pt felt dizzy while in church and started trembling. Upon arrival EMS noted pt pale and diaphoretic. Pt denies N/V/D and pt not on any diuretics.  Initial BP 76/34 >>> 150 CC Normal Saline Final BP 137/61 HR 62 CBG 162 RA 96%

## 2023-01-25 NOTE — ED Provider Notes (Signed)
[facility] Provider Note   CSN: [id number] Arrival date & time: 01/25/23  1033     History  Chief Complaint  Patient presents with   Near Syncope    Kerry Wright is a 82 y.o. female.  The history is provided by the patient and medical records. No language interpreter was used.  Near Syncope This is a recurrent problem. The current episode started less than 1 hour ago. The problem occurs rarely. The problem has been resolved. Pertinent negatives include no chest pain, no abdominal pain, no headaches and no shortness of breath. Nothing aggravates the symptoms. Nothing relieves the symptoms. She has tried nothing for the symptoms. The treatment provided no relief.       Home Medications Prior to Admission medications   Medication Sig Start Date End Date Taking? Authorizing Provider  aspirin-acetaminophen-caffeine (EXCEDRIN MIGRAINE) [phone] MG per tablet Take 1 tablet by mouth every 6 (six) hours as needed for headache.    [provider]  atenolol (TENORMIN) 25 MG tablet Take 25 mg by mouth every morning.    [provider]  atorvastatin (LIPITOR) 10 MG tablet Take 10 mg by mouth daily.    [provider]  levothyroxine (SYNTHROID, LEVOTHROID) 100 MCG tablet Take 100 mcg by mouth daily before breakfast.    [provider]  lisinopril (PRINIVIL,ZESTRIL) 5 MG tablet Take 5 mg by mouth at bedtime.    [provider]      Allergies    Ivp dye [iodinated contrast media], Sulfa antibiotics, and Penicillins    Review of Systems   Review of Systems  Constitutional:  Negative for chills, fatigue and fever.  HENT:  Negative for congestion.   Respiratory:  Negative for cough, chest tightness, shortness of breath and wheezing.   Cardiovascular:  Positive for near-syncope. Negative for chest pain and palpitations.  Gastrointestinal:  Negative for abdominal distention, abdominal pain,  constipation, diarrhea, nausea and vomiting.  Genitourinary:  Negative for dysuria and flank pain.  Musculoskeletal:  Negative for back pain, neck pain and neck stiffness.  Skin:  Negative for rash and wound.  Neurological:  Negative for weakness, light-headedness, numbness and headaches.  Psychiatric/Behavioral:  Negative for agitation and confusion.   All other systems reviewed and are negative.   Physical Exam Updated Vital Signs BP (!) 121/58 (BP Location: Left Arm)   Pulse 63   Temp 97.7 F (36.5 C) (Oral)   Resp 18   Ht '4\' 8"'$  (1.422 m)   Wt 68.9 kg   SpO2 100%   BMI 34.08 kg/m  Physical Exam Vitals and nursing note reviewed.  Constitutional:      General: She is not in acute distress.    Appearance: She is well-developed. She is not ill-appearing, toxic-appearing or diaphoretic.  HENT:     Head: Normocephalic and atraumatic.     Nose: Nose normal. No congestion or rhinorrhea.     Mouth/Throat:     Mouth: Mucous membranes are dry.     Pharynx: No oropharyngeal exudate or posterior oropharyngeal erythema.  Eyes:     Extraocular Movements: Extraocular movements intact.     Conjunctiva/sclera: Conjunctivae normal.     Pupils: Pupils are equal, round, and reactive to light.  Cardiovascular:     Rate and Rhythm: Normal rate and regular rhythm.     Heart sounds: No murmur heard. Pulmonary:     Effort: Pulmonary effort is normal. No respiratory distress.  Breath sounds: Normal breath sounds. No wheezing, rhonchi or rales.  Chest:     Chest wall: No tenderness.  Abdominal:     General: Abdomen is flat.     Palpations: Abdomen is soft.     Tenderness: There is no abdominal tenderness. There is no right CVA tenderness, left CVA tenderness, guarding or rebound.  Musculoskeletal:        General: No swelling or tenderness.     Cervical back: Neck supple. No tenderness.     Right lower leg: No edema.     Left lower leg: No edema.  Skin:    General: Skin is warm and  dry.     Capillary Refill: Capillary refill takes less than 2 seconds.     Findings: No erythema or rash.  Neurological:     General: No focal deficit present.     Mental Status: She is alert.     Sensory: No sensory deficit.     Motor: No weakness.  Psychiatric:        Mood and Affect: Mood normal.     ED Results / Procedures / Treatments   Labs (all labs ordered are listed, but only abnormal results are displayed) Labs Reviewed  URINALYSIS, ROUTINE W REFLEX MICROSCOPIC - Abnormal; Notable for the following components:      Result Value   APPearance HAZY (*)    Hgb urine dipstick LARGE (*)    Protein, ur 30 (*)    Nitrite POSITIVE (*)    Leukocytes,Ua LARGE (*)    Bacteria, UA RARE (*)    All other components within normal limits  CBC WITH DIFFERENTIAL/PLATELET - Abnormal; Notable for the following components:   RBC 3.73 (*)    Hemoglobin 11.6 (*)    All other components within normal limits  COMPREHENSIVE METABOLIC PANEL - Abnormal; Notable for the following components:   Glucose, Bld 140 (*)    BUN 37 (*)    Calcium 8.8 (*)    Total Protein 6.3 (*)    GFR, Estimated 59 (*)    All other components within normal limits  CBG MONITORING, ED - Abnormal; Notable for the following components:   Glucose-Capillary 116 (*)    All other components within normal limits  URINE CULTURE  TSH  LIPASE, BLOOD    EKG EKG Interpretation  Date/Time:  Sunday January 25 2023 10:42:24 EDT Ventricular Rate:  66 PR Interval:  153 QRS Duration: 121 QT Interval:  414 QTC Calculation: 434 R Axis:   -21 Text Interpretation: Sinus rhythm Nonspecific intraventricular conduction delay ST elevation, consider inferior injury No prior ECG for comparison No STEMI Confirmed by Antony Blackbird [phone]) on 01/25/2023 10:46:27 AM  Radiology No results found.  Procedures Procedures    Medications Ordered in ED Medications  sodium chloride 0.9 % bolus 500 mL (0 mLs Intravenous Stopped 01/25/23  1206)    ED Course/ Medical Decision Making/ A&P                             Medical Decision Making Amount and/or Complexity of Data Reviewed Labs: ordered.    Kerry Wright is a 82 y.o. female with a past medical history significant for kidney stones, hypertension, hypothyroidism, and previous low blood pressures presents with near syncope at church.  According to patient, she did not eat or drink and then this morning and went to church and got an episode of lightheadedness while sitting  and singing.  She reports that things started to go blurry and lightheaded and she had to get help to sit down.  She denies any palpitations chest pain or shortness of breath.  Denies any headache or neck pain.  Reports she did not have a fall or trauma but got lightheaded and nauseous before starting to feel better.  EMS reported blood pressures in the 70s initially but after 100 cc of fluid it improved.  Patient reports she is feeling much better now.  She think she was dehydrated.  She does report she has had urinary frequency recently and is lost about 5 pounds over the last few days.  She denies constipation or diarrhea.  Denies any trauma.  Denies any fevers, chills, congestion, cough, or other complaints.  Patient is feeling well now.  On exam, lungs clear.  Chest nontender.  No murmur.  Abdomen nontender.  No evidence of traumatic injuries.  No focal neurologic deficits.  Patient well-appearing.  Dry mucous membranes.  We had a shared decision-making conversation and agreed to get some screening labs to look for dehydration or UTI given her frequency.  Will give her some fluids and watch on the monitor.  Given her lack of any headache chest pain or shortness of breath we will hold on imaging of her head and chest and patient agrees.  We will reassess after fluids and labs and if she is feeling better, dissipate discharge home with likely orthostasis and near syncope in the setting of dehydration and  missing breakfast.  Her glucose was not low per EMS.   1:33 PM Patient's workup returned and patient does have evidence of urinary tract infection.  Patient agrees that this in the setting of some dehydration with not eating and drinking this morning likely to the near syncopal episode.  After fluids she is still feeling well.  She is able to get to the bathroom and back without any lightheadedness and is feeling better now.  Vital signs reassuring without hypotension now.  We discussed the plan and patient is amenable to getting prescription for antibiotics and outpatient PCP follow-up.  She will increase her hydration for the next few days and follow-up with her doctor.  She no other questions or concerns and will be discharged for outpatient follow-up.         Final Clinical Impression(s) / ED Diagnoses Final diagnoses:  Near syncope  Urinary tract infection without hematuria, site unspecified    Rx / DC Orders ED Discharge Orders          Ordered    nitrofurantoin, macrocrystal-monohydrate, (MACROBID) 100 MG capsule  2 times daily        01/25/23 1342            Clinical Impression: 1. Near syncope   2. Urinary tract infection without hematuria, site unspecified     Disposition: Discharge  Condition: Good  I have discussed the results, Dx and Tx plan with the pt(& family if present). He/she/they expressed understanding and agree(s) with the plan. Discharge instructions discussed at great length. Strict return precautions discussed and pt &/or family have verbalized understanding of the instructions. No further questions at time of discharge.    New Prescriptions   NITROFURANTOIN, MACROCRYSTAL-MONOHYDRATE, (MACROBID) 100 MG CAPSULE    Take 1 capsule (100 mg total) by mouth 2 (two) times daily for 5 days.    Follow Up: Charlane Ferretti, MD [address] [place] [address] [phone]  [facility] [address] [id number] mc [place]        Ifeoluwa Beller, Gwenyth Allegra, MD 01/25/23 1343

## 2023-01-25 NOTE — ED Notes (Signed)
Pt states she have lost 60lbs over the course of a year. Pt has been off Atenolol for over a year d/t low BP.

## 2023-01-25 NOTE — ED Notes (Signed)
RN assisted pt to restroom with wheelchair. Pt was able to transfer herself to and from wheelchair without incident. Pt denies dizziness at this time.

## 2023-01-25 NOTE — Discharge Instructions (Signed)
Your history, exam, workup today are consistent with smoldering urinary tract infection causing the frequency and may have led to the dehydration and not eating and drinking before church today likely help precipitate the near passing out or near syncopal episode.  You proved stability for over 3 hours here and felt better after fluids.  Your labs were overall reassuring aside from the urinalysis that was very consistent with UTI.  We sent a culture and if something is resistant and they will call you and change the antibiotic.  I spoke to pharmacy who recommended this antibiotic.  Please take it for the next 5 days and follow-up with your regular doctor.  If any symptoms change or worsen acutely, please return to the nearest emergency department.

## 2023-01-25 NOTE — ED Notes (Signed)
Pt states she has an overactive bladder. Pt says she has been peeing more than usual. Pt states she think she lost 5 lbs just peeing.

## 2023-01-26 DIAGNOSIS — H401134 Primary open-angle glaucoma, bilateral, indeterminate stage: Secondary | ICD-10-CM | POA: Diagnosis not present

## 2023-01-27 LAB — URINE CULTURE: Culture: >=100000 — AB

## 2023-01-28 ENCOUNTER — Telehealth (HOSPITAL_BASED_OUTPATIENT_CLINIC_OR_DEPARTMENT_OTHER): Payer: Self-pay

## 2023-01-28 NOTE — Telephone Encounter (Signed)
Post ED Visit - Positive Culture Follow-up  Culture report reviewed by antimicrobial stewardship pharmacist: [place] Team '[x]'$  Bertis Ruddy, Pharm.D. '[]'$  Heide Guile, Pharm.D., BCPS AQ-ID '[]'$  Parks Neptune, Pharm.D., BCPS '[]'$  Alycia Rossetti, Pharm.D., BCPS '[]'$  [place], Pharm.D., BCPS, AAHIVP '[]'$  Legrand Como, Pharm.D., BCPS, AAHIVP '[]'$  Salome Arnt, PharmD, BCPS '[]'$  Johnnette Gourd, PharmD, BCPS '[]'$  Hughes Better, PharmD, BCPS '[]'$  Leeroy Cha, PharmD '[]'$  Laqueta Linden, PharmD, BCPS '[]'$  Albertina Parr, PharmD  [place] Team '[]'$  Leodis Sias, PharmD '[]'$  Lindell Spar, PharmD '[]'$  Royetta Asal, PharmD '[]'$  Graylin Shiver, Rph '[]'$  Rema Fendt) Glennon Mac, PharmD '[]'$  Arlyn Dunning, PharmD '[]'$  Netta Cedars, PharmD '[]'$  Dia Sitter, PharmD '[]'$  Leone Haven, PharmD '[]'$  Gretta Arab, PharmD '[]'$  Theodis Shove, PharmD '[]'$  Peggyann Juba, PharmD '[]'$  Reuel Boom, PharmD   Positive urine culture Treated with Nitrofurantoin Monohyd Macro, organism sensitive to the same and no further patient follow-up is required at this time.  [name], [name] 01/28/2023, 9:37 AM

## 2023-03-19 DIAGNOSIS — M1611 Unilateral primary osteoarthritis, right hip: Secondary | ICD-10-CM | POA: Diagnosis not present

## 2023-03-19 DIAGNOSIS — F325 Major depressive disorder, single episode, in full remission: Secondary | ICD-10-CM | POA: Diagnosis not present

## 2023-03-19 DIAGNOSIS — E890 Postprocedural hypothyroidism: Secondary | ICD-10-CM | POA: Diagnosis not present

## 2023-03-19 DIAGNOSIS — Z23 Encounter for immunization: Secondary | ICD-10-CM | POA: Diagnosis not present

## 2023-03-19 DIAGNOSIS — M8580 Other specified disorders of bone density and structure, unspecified site: Secondary | ICD-10-CM | POA: Diagnosis not present

## 2023-03-19 DIAGNOSIS — Z79899 Other long term (current) drug therapy: Secondary | ICD-10-CM | POA: Diagnosis not present

## 2023-03-19 DIAGNOSIS — N1831 Chronic kidney disease, stage 3a: Secondary | ICD-10-CM | POA: Diagnosis not present

## 2023-03-19 DIAGNOSIS — E7800 Pure hypercholesterolemia, unspecified: Secondary | ICD-10-CM | POA: Diagnosis not present

## 2023-03-19 DIAGNOSIS — E039 Hypothyroidism, unspecified: Secondary | ICD-10-CM | POA: Diagnosis not present

## 2023-03-19 DIAGNOSIS — I129 Hypertensive chronic kidney disease with stage 1 through stage 4 chronic kidney disease, or unspecified chronic kidney disease: Secondary | ICD-10-CM | POA: Diagnosis not present

## 2023-03-19 DIAGNOSIS — R7303 Prediabetes: Secondary | ICD-10-CM | POA: Diagnosis not present

## 2023-03-19 DIAGNOSIS — M8589 Other specified disorders of bone density and structure, multiple sites: Secondary | ICD-10-CM | POA: Diagnosis not present

## 2023-03-19 DIAGNOSIS — Z1331 Encounter for screening for depression: Secondary | ICD-10-CM | POA: Diagnosis not present

## 2023-03-19 DIAGNOSIS — Z0000 Encounter for general adult medical examination without abnormal findings: Secondary | ICD-10-CM | POA: Diagnosis not present

## 2023-03-19 DIAGNOSIS — E0500 Thyrotoxicosis with diffuse goiter without thyrotoxic crisis or storm: Secondary | ICD-10-CM | POA: Diagnosis not present

## 2023-03-19 DIAGNOSIS — R7309 Other abnormal glucose: Secondary | ICD-10-CM | POA: Diagnosis not present

## 2023-03-19 DIAGNOSIS — E559 Vitamin D deficiency, unspecified: Secondary | ICD-10-CM | POA: Diagnosis not present

## 2023-04-27 DIAGNOSIS — H401134 Primary open-angle glaucoma, bilateral, indeterminate stage: Secondary | ICD-10-CM | POA: Diagnosis not present

## 2023-05-15 DIAGNOSIS — H26492 Other secondary cataract, left eye: Secondary | ICD-10-CM | POA: Diagnosis not present

## 2023-05-15 DIAGNOSIS — Z947 Corneal transplant status: Secondary | ICD-10-CM | POA: Diagnosis not present

## 2023-05-15 DIAGNOSIS — H401134 Primary open-angle glaucoma, bilateral, indeterminate stage: Secondary | ICD-10-CM | POA: Diagnosis not present

## 2023-05-15 DIAGNOSIS — Z961 Presence of intraocular lens: Secondary | ICD-10-CM | POA: Diagnosis not present

## 2023-06-16 DIAGNOSIS — N1831 Chronic kidney disease, stage 3a: Secondary | ICD-10-CM | POA: Diagnosis not present

## 2023-06-16 DIAGNOSIS — I129 Hypertensive chronic kidney disease with stage 1 through stage 4 chronic kidney disease, or unspecified chronic kidney disease: Secondary | ICD-10-CM | POA: Diagnosis not present

## 2023-06-16 DIAGNOSIS — E7800 Pure hypercholesterolemia, unspecified: Secondary | ICD-10-CM | POA: Diagnosis not present

## 2023-06-16 DIAGNOSIS — M1611 Unilateral primary osteoarthritis, right hip: Secondary | ICD-10-CM | POA: Diagnosis not present

## 2023-07-22 DIAGNOSIS — H26492 Other secondary cataract, left eye: Secondary | ICD-10-CM | POA: Diagnosis not present

## 2023-07-22 DIAGNOSIS — Z961 Presence of intraocular lens: Secondary | ICD-10-CM | POA: Diagnosis not present

## 2023-07-27 DIAGNOSIS — H401134 Primary open-angle glaucoma, bilateral, indeterminate stage: Secondary | ICD-10-CM | POA: Diagnosis not present

## 2023-09-23 DIAGNOSIS — Z947 Corneal transplant status: Secondary | ICD-10-CM | POA: Diagnosis not present

## 2023-09-23 DIAGNOSIS — Z9889 Other specified postprocedural states: Secondary | ICD-10-CM | POA: Diagnosis not present

## 2023-09-23 DIAGNOSIS — H401134 Primary open-angle glaucoma, bilateral, indeterminate stage: Secondary | ICD-10-CM | POA: Diagnosis not present

## 2023-09-23 DIAGNOSIS — Z961 Presence of intraocular lens: Secondary | ICD-10-CM | POA: Diagnosis not present

## 2023-10-21 DIAGNOSIS — M549 Dorsalgia, unspecified: Secondary | ICD-10-CM | POA: Diagnosis not present

## 2023-10-21 DIAGNOSIS — F321 Major depressive disorder, single episode, moderate: Secondary | ICD-10-CM | POA: Diagnosis not present

## 2023-10-21 DIAGNOSIS — Z23 Encounter for immunization: Secondary | ICD-10-CM | POA: Diagnosis not present

## 2023-10-21 DIAGNOSIS — N3281 Overactive bladder: Secondary | ICD-10-CM | POA: Diagnosis not present

## 2023-10-21 DIAGNOSIS — I129 Hypertensive chronic kidney disease with stage 1 through stage 4 chronic kidney disease, or unspecified chronic kidney disease: Secondary | ICD-10-CM | POA: Diagnosis not present

## 2024-01-25 DIAGNOSIS — H401134 Primary open-angle glaucoma, bilateral, indeterminate stage: Secondary | ICD-10-CM | POA: Diagnosis not present

## 2024-02-01 DIAGNOSIS — H401134 Primary open-angle glaucoma, bilateral, indeterminate stage: Secondary | ICD-10-CM | POA: Diagnosis not present

## 2024-02-17 DIAGNOSIS — N3281 Overactive bladder: Secondary | ICD-10-CM | POA: Diagnosis not present

## 2024-02-17 DIAGNOSIS — M549 Dorsalgia, unspecified: Secondary | ICD-10-CM | POA: Diagnosis not present

## 2024-02-18 DIAGNOSIS — M549 Dorsalgia, unspecified: Secondary | ICD-10-CM | POA: Diagnosis not present

## 2024-03-02 DIAGNOSIS — Z947 Corneal transplant status: Secondary | ICD-10-CM | POA: Diagnosis not present

## 2024-03-02 DIAGNOSIS — H02403 Unspecified ptosis of bilateral eyelids: Secondary | ICD-10-CM | POA: Diagnosis not present

## 2024-03-02 DIAGNOSIS — H401134 Primary open-angle glaucoma, bilateral, indeterminate stage: Secondary | ICD-10-CM | POA: Diagnosis not present

## 2024-03-02 DIAGNOSIS — Z961 Presence of intraocular lens: Secondary | ICD-10-CM | POA: Diagnosis not present

## 2024-03-07 NOTE — Therapy (Signed)
 OUTPATIENT PHYSICAL THERAPY THORACOLUMBAR EVALUATION   Patient Name: Kerry Wright MRN: [id number] DOB:01/16/1941, 83 y.o., female Today's Date: 03/08/2024  END OF SESSION:  PT End of Session - 03/08/24 1148     Visit Number 1    Date for PT Re-Evaluation 05/03/24    Authorization Type [employer]    Progress Note Due on Visit 10    PT Start Time 1102    PT Stop Time 1146    PT Time Calculation (min) 44 min    Activity Tolerance Patient tolerated treatment well    Behavior During Therapy WFL for tasks assessed/performed             Past Medical History:  Diagnosis Date   Complication of anesthesia    Headache(784.0)    hx. migraines - less occurring now.   History of kidney stones    remains with kidney stones(not a problem at this time)   Hypertension    Hyperthyroidism    hx. Graves disease "'tx.with radioactive iodine"   Osteoarthritis    PONV (postoperative nausea and vomiting)    Past Surgical History:  Procedure Laterality Date   COLONOSCOPY WITH PROPOFOL  N/A 07/31/2014   Procedure: COLONOSCOPY WITH PROPOFOL ;  Surgeon: Garrett Kallman, MD;  Location: [facility];  Service: Endoscopy;  Laterality: N/A;   TONSILLECTOMY     child   There are no active problems to display for this patient.   REFERRING PROVIDER/PCP: Tressia Fry, MD   REFERRING DIAG: M54.9 (ICD-10-CM) - Back pain, Rt sided thoracic pain with muscle spasms  Rationale for Evaluation and Treatment: Rehabilitation  THERAPY DIAG:  Pain in thoracic spine - Plan: PT plan of care cert/re-cert  Other low back pain - Plan: PT plan of care cert/re-cert  Other abnormalities of gait and mobility - Plan: PT plan of care cert/re-cert  Cramp and spasm - Plan: PT plan of care cert/re-cert  Muscle weakness (generalized) - Plan: PT plan of care cert/re-cert  ONSET DATE: 4 month history   SUBJECTIVE:                                                                                                                                                                                            SUBJECTIVE STATEMENT: Pt present to PT with Rt>Lt thoracic pain and spasms that began 10/2023. MD has prescribed muscle relaxers and is not sure if it is helping. She walks with a quad cane with significant trunk flexion.     PERTINENT HISTORY:  HTN, Rt hip OA (needs THA), migraines   PAIN: 03/08/24 Are you having pain? Yes: NPRS scale: 0-10/10 Pain  location: bil thoracic and low back  Pain description: shooting, aching  Aggravating factors: early morning, bending over to reach, sometimes just sitting still  Relieving factors: pain patch, Aleve  PRECAUTIONS: Fall  RED FLAGS: None   WEIGHT BEARING RESTRICTIONS: No  FALLS:  Has patient fallen in last 6 months? No  LIVING ENVIRONMENT: Lives with: lives alone Lives in: House/apartment Stairs: Yes: Internal: 12 steps; on right going uplives on main level, doesn't need to go up steps  Has following equipment at home: Chiropractor, Environmental consultant - 4 wheeled, and shower chair  OCCUPATION: retired   PLOF: Independent with community mobility without device and Leisure: watches TV, reading, crossword puzzles, uses hand weights for exercise  PATIENT GOALS: reduce back pain, improve stability   NEXT MD VISIT: May 2025  OBJECTIVE:  Note: Objective measures were completed at Evaluation unless otherwise noted.  DIAGNOSTIC FINDINGS:  None   PATIENT SURVEYS:  03/07/24: Modified Oswestry 18/45 =40% disability   COGNITION: Overall cognitive status: Within functional limits for tasks assessed     SENSATION: WFL   POSTURE: rounded shoulders, forward head, flexed trunk , and weight shift left  PALPATION: Palpable tenderness over Lt>Rt thoracic paraspinals, Lt rhomboids and lumbar paraspinals   LOWER EXTREMITY MMT:    Rt hip rests in ER  4/5, Lt hip 4+/5, bil knees 4+/5   FUNCTIONAL TESTS:  03/08/24: 5 times sit to stand: 20.02 seconds with  uncontrolled descent  GAIT: Distance walked: 50 Assistive device utilized: Quad cane large base Level of assistance: Modified independence Comments: ER at Rt hip, flexed trunk  TREATMENT DATE:  03/07/24 Findings from evaluation discussed, pt educated on plan of care, HEP initiated.                                                                                                                                  PATIENT EDUCATION:  Education details: [id number], need for walker and flat shoes, posture education Person educated: Patient Education method: Explanation, Demonstration, and Handouts Education comprehension: verbalized understanding and returned demonstration  HOME EXERCISE PROGRAM: Access Code: [email or web address] URL: [email or web address] Date: 03/08/2024 Prepared by: Loetta Ringer  Exercises - Supine Hip Internal and External Rotation  - 1 x daily - 7 x weekly - 3 sets - 10 reps - Seated Scapular Retraction  - 5 x daily - 7 x weekly - 1 sets - 10 reps - 5 hold - Seated Thoracic Flexion and Rotation with Arms Crossed  - 3 x daily - 7 x weekly - 1 sets - 3 reps - 10 hold - Seated Correct Posture  - 1 x daily - 7 x weekly - 3 sets - 10 reps - Correct Standing Posture  - 1 x daily - 7 x weekly - 3 sets - 10 reps  ASSESSMENT:  CLINICAL IMPRESSION: Patient is a 83 y.o. female who was seen today for physical therapy evaluation and treatment for thoracic and low  back pain. Pt also presents with gait abnormality, ER of Rt hip and significant OA in the Rt hip with plan for THA soon. Pt reports 0-10/10 pain in Lt>Rt thoracic and lumbar spine that is intermittent and feels like the muscles are grabbing.  She has significant flexed trunk posture and Rt hip is in fixed ER.  She is not able to hold erect posture more than 10 seconds.  She ambulates with a quad cane and reports that she uses 2 quad canes instead of using a walker at home. PT advised that she wears flat shoes (has heels on  today) and use walker to support spine and improve posture in standing.  5x sit to stand is 20 seconds with uncontrolled descent.  She has palpable tension over Lt>Rt thoracic and lumbar spine. Patient will benefit from skilled PT to address the below impairments and improve overall function.   OBJECTIVE IMPAIRMENTS: Abnormal gait, decreased activity tolerance, decreased balance, decreased mobility, difficulty walking, decreased strength, decreased safety awareness, hypomobility, increased muscle spasms, impaired flexibility, improper body mechanics, postural dysfunction, and pain.   ACTIVITY LIMITATIONS: carrying, lifting, standing, squatting, stairs, transfers, and locomotion level  PARTICIPATION LIMITATIONS: meal prep, cleaning, laundry, driving, community activity, and church  PERSONAL FACTORS: Age, Time since onset of injury/illness/exacerbation, and 1-2 comorbidities: Lt hip OA, chronic gait abnormality  are also affecting patient's functional outcome.   REHAB POTENTIAL: Good  CLINICAL DECISION MAKING: Evolving/moderate complexity  EVALUATION COMPLEXITY: Moderate   GOALS: Goals reviewed with patient? Yes  SHORT TERM GOALS: Target date: 04/05/2024      Be independent in initial HEP Baseline: Goal status: INITIAL  2.  Perform 3 min walk test to establish baseline and new goal set after Baseline:  Goal status: INITIAL  3.  Report > or = to 25% reduction in LBP/thoracic pain with ADLs and self-care Baseline:  Goal status: INITIAL  4.  Improve LE strength to perform stand to sit with controlled descent x 5 reps  Baseline:  Goal status: INITIAL  5.  Report postural corrections at home to reduce lumbar/thoracic stress with standing Baseline:  Goal status: INITIAL   LONG TERM GOALS: Target date: 05/03/2024    Be independent in advanced HEP Baseline:  Goal status: INITIAL  2.  Improve Modified Oswestry to < or = to 13/45 (29% disability) Baseline: 40% disability  Goal  status: INITIAL  3.  Report > or = to 50% reduction in LBP/thoracic pain with ADLs and self-care Baseline:  Goal status: INITIAL  4.  Perform sit to stand in < or = to 15 seconds with min UE support to reduce falls risk Baseline: 20 seconds  Goal status: INITIAL  5.  Stand and walk for 6-8 minutes without limitation for improved independence in the community Baseline:  Goal status: INITIAL   PLAN:  PT FREQUENCY: 2x/week  PT DURATION: 8 weeks  PLANNED INTERVENTIONS: 97110-Therapeutic exercises, 97530- Therapeutic activity, 97112- Neuromuscular re-education, 97535- Self Care, 16109- Manual therapy, [phone]- Gait training, [phone]- Canalith repositioning, [id number]- Aquatic Therapy, [phone]- Electrical stimulation (unattended), [phone]- Electrical stimulation (manual), Patient/Family education, Stair training, Taping, Dry Needling, Joint mobilization, Spinal manipulation, Spinal mobilization, Vestibular training, Cryotherapy, and Moist heat.  PLAN FOR NEXT SESSION: dry needling to thoracic and lumbar (issue education handout), work on posture, 3 min walk test, lumbar and hip flexibility   Luella Sager, PT 03/08/24 12:23 PM  [facility] [address] [place], [place] [address] Phone # [phone] Fax [phone]

## 2024-03-08 ENCOUNTER — Ambulatory Visit: Attending: Internal Medicine

## 2024-03-08 ENCOUNTER — Other Ambulatory Visit: Payer: Self-pay

## 2024-03-08 DIAGNOSIS — R252 Cramp and spasm: Secondary | ICD-10-CM | POA: Diagnosis not present

## 2024-03-08 DIAGNOSIS — R2689 Other abnormalities of gait and mobility: Secondary | ICD-10-CM | POA: Insufficient documentation

## 2024-03-08 DIAGNOSIS — M5459 Other low back pain: Secondary | ICD-10-CM | POA: Diagnosis not present

## 2024-03-08 DIAGNOSIS — M6281 Muscle weakness (generalized): Secondary | ICD-10-CM | POA: Diagnosis not present

## 2024-03-08 DIAGNOSIS — M546 Pain in thoracic spine: Secondary | ICD-10-CM | POA: Insufficient documentation

## 2024-03-09 DIAGNOSIS — H401134 Primary open-angle glaucoma, bilateral, indeterminate stage: Secondary | ICD-10-CM | POA: Diagnosis not present

## 2024-03-16 ENCOUNTER — Ambulatory Visit

## 2024-03-16 DIAGNOSIS — R2689 Other abnormalities of gait and mobility: Secondary | ICD-10-CM | POA: Diagnosis not present

## 2024-03-16 DIAGNOSIS — M546 Pain in thoracic spine: Secondary | ICD-10-CM

## 2024-03-16 DIAGNOSIS — M5459 Other low back pain: Secondary | ICD-10-CM | POA: Diagnosis not present

## 2024-03-16 DIAGNOSIS — R252 Cramp and spasm: Secondary | ICD-10-CM | POA: Diagnosis not present

## 2024-03-16 DIAGNOSIS — M6281 Muscle weakness (generalized): Secondary | ICD-10-CM | POA: Diagnosis not present

## 2024-03-16 NOTE — Therapy (Signed)
 OUTPATIENT PHYSICAL THERAPY TREATMENT   Patient Name: Kerry Wright MRN: [id number] DOB:02/27/1941, 83 y.o., female Today's Date: 03/16/2024  END OF SESSION:  PT End of Session - 03/16/24 1459     Visit Number 2    Date for PT Re-Evaluation 05/03/24    Authorization Type [employer] Medicare    Progress Note Due on Visit 10    PT Start Time 1403    PT Stop Time 1451    PT Time Calculation (min) 48 min    Activity Tolerance Patient tolerated treatment well    Behavior During Therapy WFL for tasks assessed/performed              Past Medical History:  Diagnosis Date   Complication of anesthesia    Headache(784.0)    hx. migraines - less occurring now.   History of kidney stones    remains with kidney stones(not a problem at this time)   Hypertension    Hyperthyroidism    hx. Graves disease "'tx.with radioactive iodine"   Osteoarthritis    PONV (postoperative nausea and vomiting)    Past Surgical History:  Procedure Laterality Date   COLONOSCOPY WITH PROPOFOL  N/A 07/31/2014   Procedure: COLONOSCOPY WITH PROPOFOL ;  Surgeon: Garrett Kallman, MD;  Location: [facility];  Service: Endoscopy;  Laterality: N/A;   TONSILLECTOMY     child   There are no active problems to display for this patient.   REFERRING PROVIDER/PCP: Tressia Fry, MD   REFERRING DIAG: M54.9 (ICD-10-CM) - Back pain, Rt sided thoracic pain with muscle spasms  Rationale for Evaluation and Treatment: Rehabilitation  THERAPY DIAG:  Pain in thoracic spine  Other low back pain  Other abnormalities of gait and mobility  Cramp and spasm  Muscle weakness (generalized)  ONSET DATE: 4 month history   SUBJECTIVE:                                                                                                                                                                                           SUBJECTIVE STATEMENT: Pt wearing flat shoes and walking with walker today.  My back is giving me a fit  today.     From Evaluation: Pt present to PT with Rt>Lt thoracic pain and spasms that began 10/2023. MD has prescribed muscle relaxers and is not sure if it is helping. She walks with a quad cane with significant trunk flexion.     PERTINENT HISTORY:  HTN, Rt hip OA (needs THA), migraines   PAIN: 03/16/24 Are you having pain? Yes: NPRS scale: 0-10/10 Pain location: bil thoracic and low back  Pain description: shooting, aching  Aggravating factors: early morning, bending over to reach, sometimes just sitting still  Relieving factors: pain patch, Aleve  PRECAUTIONS: Fall  RED FLAGS: None   WEIGHT BEARING RESTRICTIONS: No  FALLS:  Has patient fallen in last 6 months? No  LIVING ENVIRONMENT: Lives with: lives alone Lives in: House/apartment Stairs: Yes: Internal: 12 steps; on right going uplives on main level, doesn't need to go up steps  Has following equipment at home: Chiropractor, Environmental consultant - 4 wheeled, and shower chair  OCCUPATION: retired   PLOF: Independent with community mobility without device and Leisure: watches TV, reading, crossword puzzles, uses hand weights for exercise  PATIENT GOALS: reduce back pain, improve stability   NEXT MD VISIT: May 2025  OBJECTIVE:  Note: Objective measures were completed at Evaluation unless otherwise noted.  DIAGNOSTIC FINDINGS:  None   PATIENT SURVEYS:  03/07/24: Modified Oswestry 18/45 =40% disability   COGNITION: Overall cognitive status: Within functional limits for tasks assessed     SENSATION: WFL   POSTURE: rounded shoulders, forward head, flexed trunk , and weight shift left  PALPATION: Palpable tenderness over Lt>Rt thoracic paraspinals, Lt rhomboids and lumbar paraspinals   LOWER EXTREMITY MMT:    Rt hip rests in ER  4/5, Lt hip 4+/5, bil knees 4+/5   FUNCTIONAL TESTS:  03/08/24: 5 times sit to stand: 20.02 seconds with uncontrolled descent 03/16/24: 3 min walk test 264 feet with 8/10  RPE  GAIT: Distance walked: 50 Assistive device utilized: Quad cane large base Level of assistance: Modified independence Comments: ER at Rt hip, flexed trunk  TREATMENT DATE:  03/16/24: NuStep: level 3x 8 minutes- PT present to discuss progress Seated thoracic rotation 3x20 seconds  Scap squeezes: 5" hold x10 Hip IR in supine x10 Seated rows and ER with yellow band 2x10  3 min walk test Trigger Point Dry Needling  Initial Treatment: Pt instructed on Dry Needling rational, procedures, and possible side effects. Pt instructed to expect mild to moderate muscle soreness later in the day and/or into the next day.  Pt instructed in methods to reduce muscle soreness. Pt instructed to continue prescribed HEP. Because Dry Needling was performed over or adjacent to a lung field, pt was educated on S/S of pneumothorax and to seek immediate medical attention should they occur.  Patient was educated on signs and symptoms of infection and other risk factors and advised to seek medical attention should they occur.  Patient verbalized understanding of these instructions and education.   Patient Verbal Consent Given: Yes Education Handout Provided: Yes Muscles Treated: thoracic multifidi and Rt lats along ribs- Pt removed Salanpas patch and had skin irritation under the patch.  PT advised pt and her niece Moira Andrews to apply cortizone cream if needed. Electrical Stimulation Performed: No Treatment Response/Outcome: twitch repsonse and improved tissue mobility  Manual: elongation and release to muscles treated with dry needling    03/07/24 Findings from evaluation discussed, pt educated on plan of care, HEP initiated.  PATIENT EDUCATION:  Education details: [id number], need for walker and flat shoes, posture education Person educated: Patient Education method: Explanation,  Demonstration, and Handouts Education comprehension: verbalized understanding and returned demonstration  HOME EXERCISE PROGRAM: Access Code: [email or web address] URL: [email or web address] Date: 03/08/2024 Prepared by: Loetta Ringer  Exercises - Supine Hip Internal and External Rotation  - 1 x daily - 7 x weekly - 3 sets - 10 reps - Seated Scapular Retraction  - 5 x daily - 7 x weekly - 1 sets - 10 reps - 5 hold - Seated Thoracic Flexion and Rotation with Arms Crossed  - 3 x daily - 7 x weekly - 1 sets - 3 reps - 10 hold - Seated Correct Posture  - 1 x daily - 7 x weekly - 3 sets - 10 reps - Correct Standing Posture  - 1 x daily - 7 x weekly - 3 sets - 10 reps  ASSESSMENT:  CLINICAL IMPRESSION: Pt arrived with walker and flat shoes.  She will begin using her walker at home to improve safety and posture.  She reports moderate compliance with HEP and PT educated on importance of compliance with HEP for functional gains.  Pt with fixed Rt hip and bed mobility and exercise is impacted by this.  PT provided frequent verbal cues for alignment.  Initial 3 min walk test is 264 feet with RPE of 8/10. Good response to dry needling with improved tissue mobility and twitch response in thoracic spine.  Patient will benefit from skilled PT to address the below impairments and improve overall function.   OBJECTIVE IMPAIRMENTS: Abnormal gait, decreased activity tolerance, decreased balance, decreased mobility, difficulty walking, decreased strength, decreased safety awareness, hypomobility, increased muscle spasms, impaired flexibility, improper body mechanics, postural dysfunction, and pain.   ACTIVITY LIMITATIONS: carrying, lifting, standing, squatting, stairs, transfers, and locomotion level  PARTICIPATION LIMITATIONS: meal prep, cleaning, laundry, driving, community activity, and church  PERSONAL FACTORS: Age, Time since onset of injury/illness/exacerbation, and 1-2 comorbidities: Lt hip OA, chronic gait  abnormality  are also affecting patient's functional outcome.   REHAB POTENTIAL: Good  CLINICAL DECISION MAKING: Evolving/moderate complexity  EVALUATION COMPLEXITY: Moderate   GOALS: Goals reviewed with patient? Yes  SHORT TERM GOALS: Target date: 04/05/2024      Be independent in initial HEP Baseline: Goal status: in progress   2.  Perform 3 min walk test to establish baseline and new goal set after Baseline: 264 feet with RPE 8/10 Goal status: MET  3.  Report > or = to 25% reduction in LBP/thoracic pain with ADLs and self-care Baseline:  Goal status: INITIAL  4.  Improve LE strength to perform stand to sit with controlled descent x 5 reps  Baseline:  Goal status: INITIAL  5.  Report postural corrections at home to reduce lumbar/thoracic stress with standing Baseline: will start using rolling walker to assist(03/16/24) Goal status: In progress    LONG TERM GOALS: Target date: 05/03/2024    Be independent in advanced HEP Baseline:  Goal status: INITIAL  2.  Improve Modified Oswestry to < or = to 13/45 (29% disability) Baseline: 40% disability  Goal status: INITIAL  3.  Report > or = to 50% reduction in LBP/thoracic pain with ADLs and self-care Baseline:  Goal status: INITIAL  4.  Perform sit to stand in < or = to 15 seconds with min UE support to reduce falls risk Baseline: 20 seconds  Goal status: INITIAL  5.  Stand and walk for 6-8 minutes without limitation for  improved independence in the community Baseline:  Goal status: INITIAL   PLAN:  PT FREQUENCY: 2x/week  PT DURATION: 8 weeks  PLANNED INTERVENTIONS: 97110-Therapeutic exercises, 97530- Therapeutic activity, 97112- Neuromuscular re-education, 97535- Self Care, 16109- Manual therapy, [phone]- Gait training, [phone]- Canalith repositioning, [id number]- Aquatic Therapy, [phone]- Electrical stimulation (unattended), [phone]- Electrical stimulation (manual), Patient/Family education, Stair training, Taping, Dry  Needling, Joint mobilization, Spinal manipulation, Spinal mobilization, Vestibular training, Cryotherapy, and Moist heat.  PLAN FOR NEXT SESSION: assess response to dry needling,  work on posture, sit to stand, lumbar and hip flexibility   Luella Sager, PT 03/16/24 3:06 PM  [facility] [address] [place], [place] [address] Phone # [phone] Fax [phone]

## 2024-03-16 NOTE — Patient Instructions (Signed)

## 2024-03-17 NOTE — Therapy (Signed)
 OUTPATIENT PHYSICAL THERAPY TREATMENT   Patient Name: Kerry Wright MRN: [id number] DOB:16-May-1941, 83 y.o., female Today's Date: 03/18/2024  END OF SESSION:  PT End of Session - 03/18/24 1101     Visit Number 3    Date for PT Re-Evaluation 05/03/24    Authorization Type [employer]    Progress Note Due on Visit 10    PT Start Time 1017    PT Stop Time 1102    PT Time Calculation (min) 45 min    Activity Tolerance Patient tolerated treatment well    Behavior During Therapy WFL for tasks assessed/performed               Past Medical History:  Diagnosis Date   Complication of anesthesia    Headache(784.0)    hx. migraines - less occurring now.   History of kidney stones    remains with kidney stones(not a problem at this time)   Hypertension    Hyperthyroidism    hx. Graves disease "'tx.with radioactive iodine"   Osteoarthritis    PONV (postoperative nausea and vomiting)    Past Surgical History:  Procedure Laterality Date   COLONOSCOPY WITH PROPOFOL  N/A 07/31/2014   Procedure: COLONOSCOPY WITH PROPOFOL ;  Surgeon: Garrett Kallman, MD;  Location: [facility];  Service: Endoscopy;  Laterality: N/A;   TONSILLECTOMY     child   There are no active problems to display for this patient.   REFERRING PROVIDER/PCP: Tressia Fry, MD   REFERRING DIAG: M54.9 (ICD-10-CM) - Back pain, Rt sided thoracic pain with muscle spasms  Rationale for Evaluation and Treatment: Rehabilitation  THERAPY DIAG:  Pain in thoracic spine  Other low back pain  Other abnormalities of gait and mobility  Cramp and spasm  Muscle weakness (generalized)  ONSET DATE: 4 month history   SUBJECTIVE:                                                                                                                                                                                           SUBJECTIVE STATEMENT: My mid back started hurting about two hours after getting up. Took a muscle relaxor  and now no pain. The dry needling helped. I had no pain for two days.      From Evaluation: Pt present to PT with Rt>Lt thoracic pain and spasms that began 10/2023. MD has prescribed muscle relaxers and is not sure if it is helping. She walks with a quad cane with significant trunk flexion.     PERTINENT HISTORY:  HTN, Rt hip OA (needs THA), migraines   PAIN: 03/16/24 Are you having pain? Yes: NPRS  scale: 0/10 Pain location: bil thoracic and low back  Pain description: shooting, aching  Aggravating factors: early morning, bending over to reach, sometimes just sitting still  Relieving factors: pain patch, Aleve  PRECAUTIONS: Fall  RED FLAGS: None   WEIGHT BEARING RESTRICTIONS: No  FALLS:  Has patient fallen in last 6 months? No  LIVING ENVIRONMENT: Lives with: lives alone Lives in: House/apartment Stairs: Yes: Internal: 12 steps; on right going uplives on main level, doesn't need to go up steps  Has following equipment at home: Chiropractor, Environmental consultant - 4 wheeled, and shower chair  OCCUPATION: retired   PLOF: Independent with community mobility without device and Leisure: watches TV, reading, crossword puzzles, uses hand weights for exercise  PATIENT GOALS: reduce back pain, improve stability   NEXT MD VISIT: May 2025  OBJECTIVE:  Note: Objective measures were completed at Evaluation unless otherwise noted.  DIAGNOSTIC FINDINGS:  None   PATIENT SURVEYS:  03/07/24: Modified Oswestry 18/45 =40% disability   COGNITION: Overall cognitive status: Within functional limits for tasks assessed     SENSATION: WFL   POSTURE: rounded shoulders, forward head, flexed trunk , and weight shift left  PALPATION: Palpable tenderness over Lt>Rt thoracic paraspinals, Lt rhomboids and lumbar paraspinals   LOWER EXTREMITY MMT:    Rt hip rests in ER  4/5, Lt hip 4+/5, bil knees 4+/5   FUNCTIONAL TESTS:  03/08/24: 5 times sit to stand: 20.02 seconds with uncontrolled  descent 03/16/24: 3 min walk test 264 feet with 8/10 RPE  GAIT: Distance walked: 50 Assistive device utilized: Quad cane large base Level of assistance: Modified independence Comments: ER at Rt hip, flexed trunk  TREATMENT DATE:   03/18/24: NuStep: level 3x 9 minutes- PT present to discuss progress Scap squeezes: 5" hold x10 Seated trunk ext, rotation, diagonals x 10 ea holding ball and following with eyes Seated thoracic rotation 3x20 seconds  Seated lumbar ext with magenta powercord x 20 Seated rows and ER with red band 2x10  Attempted seated hip IR with ball but poor ability on R  Trigger Point Dry Needling  Subsequent Treatment: Instructions provided previously at initial dry needling treatment.   Patient Verbal Consent Given: Yes Education Handout Provided: Previously Provided Muscles Treated: L rhomoboids and R thoracic multifidi (low throacic) Electrical Stimulation Performed: No Treatment Response/Outcome: Utilized skilled palpation to identify bony landmarks and trigger points.  Able to illicit twitch response and muscle elongation.  Soft tissue mobilization to muscles needled to further promote tissue elongation and decreased pain.         03/16/24: NuStep: level 3x 8 minutes- PT present to discuss progress Seated thoracic rotation 3x20 seconds  Scap squeezes: 5" hold x10 Hip IR in supine x10 Seated rows and ER with yellow band 2x10  3 min walk test Trigger Point Dry Needling  Initial Treatment: Pt instructed on Dry Needling rational, procedures, and possible side effects. Pt instructed to expect mild to moderate muscle soreness later in the day and/or into the next day.  Pt instructed in methods to reduce muscle soreness. Pt instructed to continue prescribed HEP. Because Dry Needling was performed over or adjacent to a lung field, pt was educated on S/S of pneumothorax and to seek immediate medical attention should they occur.  Patient was educated on signs and  symptoms of infection and other risk factors and advised to seek medical attention should they occur.  Patient verbalized understanding of these instructions and education.   Patient Verbal Consent Given:  Yes Education Handout Provided: Yes Muscles Treated: thoracic multifidi and Rt lats along ribs- Pt removed Salanpas patch and had skin irritation under the patch.  PT advised pt and her niece Moira Andrews to apply cortizone cream if needed. Electrical Stimulation Performed: No Treatment Response/Outcome: twitch repsonse and improved tissue mobility  Manual: elongation and release to muscles treated with dry needling    03/07/24 Findings from evaluation discussed, pt educated on plan of care, HEP initiated.                                                                                                                                 PATIENT EDUCATION:  Education details: [id number], HEP update Person educated: Patient Education method: Explanation, Demonstration, and Handouts Education comprehension: verbalized understanding and returned demonstration  HOME EXERCISE PROGRAM: Access Code: [email or web address] URL: [email or web address] Date: 03/18/2024 Prepared by: Concha Deed  Exercises - Supine Hip Internal and External Rotation  - 1 x daily - 7 x weekly - 3 sets - 10 reps - Seated Scapular Retraction  - 5 x daily - 7 x weekly - 1 sets - 10 reps - 5 hold - Seated Thoracic Flexion and Rotation with Arms Crossed  - 3 x daily - 7 x weekly - 1 sets - 3 reps - 10 hold - Seated Correct Posture  - 1 x daily - 7 x weekly - 3 sets - 10 reps - Correct Standing Posture  - 1 x daily - 7 x weekly - 3 sets - 10 reps - Seated Hamstring Stretch  - 3 x daily - 7 x weekly - 1 sets - 3 reps - 20 hold - Supine Lower Trunk Rotation  - 3 x daily - 7 x weekly - 1 sets - 3 reps - 20 hold - Seated Thoracic Extension Arms Overhead  - 1 x daily - 7 x weekly - 1-2 sets - 10 reps - 2-3 sec hold - Seated Trunk Rotation   - 1 x daily - 7 x weekly - 1-2 sets - 10 reps - 2-3 sec hold - Seated Diagonal Chops with Medicine Ball  - 1 x daily - 7 x weekly - 1-2 sets - 10 reps - 2-3 hold  ASSESSMENT:  CLINICAL IMPRESSION: Kerry Wright did very well with exercises today. She needs some cueing for posture and form with seated thoracic exercises. No complaints of pain during TE. She still had some tightness in the areas previously needled, so these were addressed again today.   OBJECTIVE IMPAIRMENTS: Abnormal gait, decreased activity tolerance, decreased balance, decreased mobility, difficulty walking, decreased strength, decreased safety awareness, hypomobility, increased muscle spasms, impaired flexibility, improper body mechanics, postural dysfunction, and pain.   ACTIVITY LIMITATIONS: carrying, lifting, standing, squatting, stairs, transfers, and locomotion level  PARTICIPATION LIMITATIONS: meal prep, cleaning, laundry, driving, community activity, and church  PERSONAL FACTORS: Age, Time since onset of injury/illness/exacerbation, and 1-2 comorbidities: Lt hip OA, chronic gait abnormality  are also affecting  patient's functional outcome.   REHAB POTENTIAL: Good  CLINICAL DECISION MAKING: Evolving/moderate complexity  EVALUATION COMPLEXITY: Moderate   GOALS: Goals reviewed with patient? Yes  SHORT TERM GOALS: Target date: 04/05/2024      Be independent in initial HEP Baseline: Goal status: in progress   2.  Perform 3 min walk test to establish baseline and new goal set after Baseline: 264 feet with RPE 8/10 Goal status: MET  3.  Report > or = to 25% reduction in LBP/thoracic pain with ADLs and self-care Baseline:  Goal status: INITIAL  4.  Improve LE strength to perform stand to sit with controlled descent x 5 reps  Baseline:  Goal status: INITIAL  5.  Report postural corrections at home to reduce lumbar/thoracic stress with standing Baseline: will start using rolling walker to assist(03/16/24) Goal  status: In progress    LONG TERM GOALS: Target date: 05/03/2024    Be independent in advanced HEP Baseline:  Goal status: INITIAL  2.  Improve Modified Oswestry to < or = to 13/45 (29% disability) Baseline: 40% disability  Goal status: INITIAL  3.  Report > or = to 50% reduction in LBP/thoracic pain with ADLs and self-care Baseline:  Goal status: INITIAL  4.  Perform sit to stand in < or = to 15 seconds with min UE support to reduce falls risk Baseline: 20 seconds  Goal status: INITIAL  5.  Stand and walk for 6-8 minutes without limitation for improved independence in the community Baseline:  Goal status: INITIAL   PLAN:  PT FREQUENCY: 2x/week  PT DURATION: 8 weeks  PLANNED INTERVENTIONS: 97110-Therapeutic exercises, 97530- Therapeutic activity, 97112- Neuromuscular re-education, 97535- Self Care, 16109- Manual therapy, [phone]- Gait training, [phone]- Canalith repositioning, [id number]- Aquatic Therapy, [phone]- Electrical stimulation (unattended), [phone]- Electrical stimulation (manual), Patient/Family education, Stair training, Taping, Dry Needling, Joint mobilization, Spinal manipulation, Spinal mobilization, Vestibular training, Cryotherapy, and Moist heat.  PLAN FOR NEXT SESSION: assess response to dry needling,  work on posture, sit to stand, lumbar and hip flexibility   Jinx Mourning, PT  03/18/24 11:57 AM  [facility] [address] [place], [place] [address] Phone # [phone] Fax [phone]

## 2024-03-18 ENCOUNTER — Encounter: Payer: Self-pay | Admitting: Physical Therapy

## 2024-03-18 ENCOUNTER — Ambulatory Visit: Attending: Internal Medicine | Admitting: Physical Therapy

## 2024-03-18 DIAGNOSIS — R2689 Other abnormalities of gait and mobility: Secondary | ICD-10-CM | POA: Insufficient documentation

## 2024-03-18 DIAGNOSIS — R252 Cramp and spasm: Secondary | ICD-10-CM | POA: Diagnosis not present

## 2024-03-18 DIAGNOSIS — M546 Pain in thoracic spine: Secondary | ICD-10-CM | POA: Insufficient documentation

## 2024-03-18 DIAGNOSIS — M5459 Other low back pain: Secondary | ICD-10-CM | POA: Insufficient documentation

## 2024-03-18 DIAGNOSIS — M6281 Muscle weakness (generalized): Secondary | ICD-10-CM | POA: Insufficient documentation

## 2024-03-21 DIAGNOSIS — Z0000 Encounter for general adult medical examination without abnormal findings: Secondary | ICD-10-CM | POA: Diagnosis not present

## 2024-03-21 DIAGNOSIS — Z1331 Encounter for screening for depression: Secondary | ICD-10-CM | POA: Diagnosis not present

## 2024-03-21 DIAGNOSIS — D500 Iron deficiency anemia secondary to blood loss (chronic): Secondary | ICD-10-CM | POA: Diagnosis not present

## 2024-03-23 ENCOUNTER — Ambulatory Visit

## 2024-03-23 DIAGNOSIS — R2689 Other abnormalities of gait and mobility: Secondary | ICD-10-CM | POA: Diagnosis not present

## 2024-03-23 DIAGNOSIS — M546 Pain in thoracic spine: Secondary | ICD-10-CM | POA: Diagnosis not present

## 2024-03-23 DIAGNOSIS — M5459 Other low back pain: Secondary | ICD-10-CM

## 2024-03-23 DIAGNOSIS — M6281 Muscle weakness (generalized): Secondary | ICD-10-CM

## 2024-03-23 DIAGNOSIS — R252 Cramp and spasm: Secondary | ICD-10-CM

## 2024-03-23 NOTE — Therapy (Signed)
 OUTPATIENT PHYSICAL THERAPY TREATMENT   Patient Name: Kerry Wright MRN: [id number] DOB:[birth year], 83 y.o., female Today's Date: 03/23/2024  END OF SESSION:  PT End of Session - 03/23/24 1322     Visit Number 4    Date for PT Re-Evaluation 05/03/24    Authorization Type [employer] Medicare    PT Start Time 1145    PT Stop Time 1227    PT Time Calculation (min) 42 min    Activity Tolerance Patient tolerated treatment well    Behavior During Therapy WFL for tasks assessed/performed                Past Medical History:  Diagnosis Date   Complication of anesthesia    Headache(784.0)    hx. migraines - less occurring now.   History of kidney stones    remains with kidney stones(not a problem at this time)   Hypertension    Hyperthyroidism    hx. Graves disease "'tx.with radioactive iodine"   Osteoarthritis    PONV (postoperative nausea and vomiting)    Past Surgical History:  Procedure Laterality Date   COLONOSCOPY WITH PROPOFOL  N/A 07/31/2014   Procedure: COLONOSCOPY WITH PROPOFOL ;  Surgeon: Garrett Kallman, MD;  Location: [facility];  Service: Endoscopy;  Laterality: N/A;   TONSILLECTOMY     child   There are no active problems to display for this patient.   REFERRING PROVIDER/PCP: Tressia Fry, MD   REFERRING DIAG: M54.9 (ICD-10-CM) - Back pain, Rt sided thoracic pain with muscle spasms  Rationale for Evaluation and Treatment: Rehabilitation  THERAPY DIAG:  Pain in thoracic spine  Other low back pain  Other abnormalities of gait and mobility  Cramp and spasm  Muscle weakness (generalized)  ONSET DATE: 4 month history   SUBJECTIVE:                                                                                                                                                                                           SUBJECTIVE STATEMENT:  I feel about 20% better.  Yesterday was a good day and I have more tension on the Lt side of my back today.  I can  stretch to the right and it helps met.   From Evaluation: Pt present to PT with Rt>Lt thoracic pain and spasms that began 10/2023. MD has prescribed muscle relaxers and is not sure if it is helping. She walks with a quad cane with significant trunk flexion.     PERTINENT HISTORY:  HTN, Rt hip OA (needs THA), migraines   PAIN: 03/23/24 Are you having pain? Yes: NPRS scale: 0-8/10 Pain location: bil  thoracic and low back  Pain description: shooting, aching  Aggravating factors: early morning, bending over to reach, sometimes just sitting still  Relieving factors: pain patch, Aleve  PRECAUTIONS: Fall  RED FLAGS: None   WEIGHT BEARING RESTRICTIONS: No  FALLS:  Has patient fallen in last 6 months? No  LIVING ENVIRONMENT: Lives with: lives alone Lives in: House/apartment Stairs: Yes: Internal: 12 steps; on right going uplives on main level, doesn't need to go up steps  Has following equipment at home: Chiropractor, Environmental consultant - 4 wheeled, and shower chair  OCCUPATION: retired   PLOF: Independent with community mobility without device and Leisure: watches TV, reading, crossword puzzles, uses hand weights for exercise  PATIENT GOALS: reduce back pain, improve stability   NEXT MD VISIT: May 2025  OBJECTIVE:  Note: Objective measures were completed at Evaluation unless otherwise noted.  DIAGNOSTIC FINDINGS:  None   PATIENT SURVEYS:  03/07/24: Modified Oswestry 18/45 =40% disability   COGNITION: Overall cognitive status: Within functional limits for tasks assessed     SENSATION: WFL   POSTURE: rounded shoulders, forward head, flexed trunk , and weight shift left  PALPATION: Palpable tenderness over Lt>Rt thoracic paraspinals, Lt rhomboids and lumbar paraspinals   LOWER EXTREMITY MMT:    Rt hip rests in ER  4/5, Lt hip 4+/5, bil knees 4+/5   FUNCTIONAL TESTS:  03/08/24: 5 times sit to stand: 20.02 seconds with uncontrolled descent 03/16/24: 3 min walk test 264  feet with 8/10 RPE  GAIT: Distance walked: 50 Assistive device utilized: Quad cane large base Level of assistance: Modified independence Comments: ER at Rt hip, flexed trunk  TREATMENT DATE:    03/23/24: NuStep: level 3x 9 minutes- PT present to discuss progress Scap squeezes: 5" hold x10 Seated trunk ext, rotation, diagonals x 10 ea holding ball and following with eyes Seated thoracic rotation 3x20 seconds  Seated lumbar ext with magenta powercord x 20 Horizontal abduction and ER with red band 2x10  Seated shoulder flexion 2# x15, ER x15  03/18/24: NuStep: level 3x 9 minutes- PT present to discuss progress Scap squeezes: 5" hold x10 Seated trunk ext, rotation, diagonals x 10 ea holding ball and following with eyes Seated thoracic rotation 3x20 seconds  Seated lumbar ext with magenta powercord x 20 Seated rows and ER with red band 2x10  Attempted seated hip IR with ball but poor ability on R  Trigger Point Dry Needling  Subsequent Treatment: Instructions provided previously at initial dry needling treatment.   Patient Verbal Consent Given: Yes Education Handout Provided: Previously Provided Muscles Treated: L rhomoboids and R thoracic multifidi (low throacic) Electrical Stimulation Performed: No Treatment Response/Outcome: Utilized skilled palpation to identify bony landmarks and trigger points.  Able to illicit twitch response and muscle elongation.  Soft tissue mobilization to muscles needled to further promote tissue elongation and decreased pain.         03/16/24: NuStep: level 3x 8 minutes- PT present to discuss progress Seated thoracic rotation 3x20 seconds  Scap squeezes: 5" hold x10 Hip IR in supine x10 Seated rows and ER with yellow band 2x10  3 min walk test Trigger Point Dry Needling  Initial Treatment: Pt instructed on Dry Needling rational, procedures, and possible side effects. Pt instructed to expect mild to moderate muscle soreness later in the day  and/or into the next day.  Pt instructed in methods to reduce muscle soreness. Pt instructed to continue prescribed HEP. Because Dry Needling was performed over or adjacent to  a lung field, pt was educated on S/S of pneumothorax and to seek immediate medical attention should they occur.  Patient was educated on signs and symptoms of infection and other risk factors and advised to seek medical attention should they occur.  Patient verbalized understanding of these instructions and education.   Patient Verbal Consent Given: Yes Education Handout Provided: Yes Muscles Treated: thoracic multifidi and Rt lats along ribs- Pt removed Salanpas patch and had skin irritation under the patch.  PT advised pt and her niece Moira Andrews to apply cortizone cream if needed. Electrical Stimulation Performed: No Treatment Response/Outcome: twitch repsonse and improved tissue mobility  Manual: elongation and release to muscles treated with dry needling    PATIENT EDUCATION:  Education details: [id number], HEP update Person educated: Patient Education method: Explanation, Demonstration, and Handouts Education comprehension: verbalized understanding and returned demonstration  HOME EXERCISE PROGRAM: Access Code: [email or web address] URL: [email or web address] Date: 03/23/2024 Prepared by: Loetta Ringer  Exercises - Supine Hip Internal and External Rotation  - 1 x daily - 7 x weekly - 3 sets - 10 reps - Seated Scapular Retraction  - 5 x daily - 7 x weekly - 1 sets - 10 reps - 5 hold - Seated Thoracic Flexion and Rotation with Arms Crossed  - 3 x daily - 7 x weekly - 1 sets - 3 reps - 10 hold - Seated Correct Posture  - 1 x daily - 7 x weekly - 3 sets - 10 reps - Correct Standing Posture  - 1 x daily - 7 x weekly - 3 sets - 10 reps - Seated Hamstring Stretch  - 3 x daily - 7 x weekly - 1 sets - 3 reps - 20 hold - Supine Lower Trunk Rotation  - 3 x daily - 7 x weekly - 1 sets - 3 reps - 20 hold - Seated Thoracic  Extension Arms Overhead  - 1 x daily - 7 x weekly - 1-2 sets - 10 reps - 2-3 sec hold - Seated Trunk Rotation  - 1 x daily - 7 x weekly - 1-2 sets - 10 reps - 2-3 sec hold - Seated Diagonal Chops with Medicine Ball  - 1 x daily - 7 x weekly - 1-2 sets - 10 reps - 2-3 hold - Seated Bilateral Shoulder External Rotation with Resistance  - 1 x daily - 7 x weekly - 2 sets - 10 reps - Seated Shoulder Horizontal Abduction with Resistance  - 1 x daily - 7 x weekly - 2 sets - 10 reps ASSESSMENT:  CLINICAL IMPRESSION: Talesa  reports 20% reduction in LBP since the start of care.  She did very well with exercises today. Pt with significant trunk flexion and is challenged with correction due to functional strength deficits. We continue to work on these with strength and mobility exercises. When pt corrects her trunk, she is not able to get Rt foot on the floor due to fixed hip.  She was able to make corrections and sit upright with pad under feet during seated weight training.  No complaints of pain during TE. Patient will benefit from skilled PT to address the below impairments and improve overall function.   OBJECTIVE IMPAIRMENTS: Abnormal gait, decreased activity tolerance, decreased balance, decreased mobility, difficulty walking, decreased strength, decreased safety awareness, hypomobility, increased muscle spasms, impaired flexibility, improper body mechanics, postural dysfunction, and pain.   ACTIVITY LIMITATIONS: carrying, lifting, standing, squatting, stairs, transfers, and locomotion level  PARTICIPATION LIMITATIONS: meal prep, cleaning, laundry, driving, community  activity, and church  PERSONAL FACTORS: Age, Time since onset of injury/illness/exacerbation, and 1-2 comorbidities: Lt hip OA, chronic gait abnormality  are also affecting patient's functional outcome.   REHAB POTENTIAL: Good  CLINICAL DECISION MAKING: Evolving/moderate complexity  EVALUATION COMPLEXITY: Moderate   GOALS: Goals  reviewed with patient? Yes  SHORT TERM GOALS: Target date: 04/05/2024      Be independent in initial HEP Baseline: Goal status: in progress   2.  Perform 3 min walk test to establish baseline and new goal set after Baseline: 264 feet with RPE 8/10 Goal status: MET  3.  Report > or = to 25% reduction in LBP/thoracic pain with ADLs and self-care Baseline: 20% reduction (03/23/24) Goal status: In progress  4.  Improve LE strength to perform stand to sit with controlled descent x 5 reps  Baseline:  Goal status: INITIAL  5.  Report postural corrections at home to reduce lumbar/thoracic stress with standing Baseline: will start using rolling walker to assist(03/16/24) Goal status: In progress    LONG TERM GOALS: Target date: 05/03/2024    Be independent in advanced HEP Baseline:  Goal status: INITIAL  2.  Improve Modified Oswestry to < or = to 13/45 (29% disability) Baseline: 40% disability  Goal status: INITIAL  3.  Report > or = to 50% reduction in LBP/thoracic pain with ADLs and self-care Baseline:  Goal status: INITIAL  4.  Perform sit to stand in < or = to 15 seconds with min UE support to reduce falls risk Baseline: 20 seconds  Goal status: INITIAL  5.  Stand and walk for 6-8 minutes without limitation for improved independence in the community Baseline:  Goal status: INITIAL   PLAN:  PT FREQUENCY: 2x/week  PT DURATION: 8 weeks  PLANNED INTERVENTIONS: 97110-Therapeutic exercises, 97530- Therapeutic activity, 97112- Neuromuscular re-education, 97535- Self Care, 82956- Manual therapy, [phone]- Gait training, [phone]- Canalith repositioning, [id number]- Aquatic Therapy, [phone]- Electrical stimulation (unattended), [phone]- Electrical stimulation (manual), Patient/Family education, Stair training, Taping, Dry Needling, Joint mobilization, Spinal manipulation, Spinal mobilization, Vestibular training, Cryotherapy, and Moist heat.  PLAN FOR NEXT SESSION: assess response to dry  needling, work on posture, sit to stand, lumbar and hip flexibility. Dry needling to thoracic/rhomboids    Luella Sager, PT 03/23/24 1:23 PM  [facility] [address] [place], [place] [address] Phone # [phone] Fax [phone]

## 2024-03-24 ENCOUNTER — Other Ambulatory Visit (HOSPITAL_COMMUNITY): Payer: Self-pay | Admitting: Internal Medicine

## 2024-03-24 DIAGNOSIS — D508 Other iron deficiency anemias: Secondary | ICD-10-CM

## 2024-03-24 DIAGNOSIS — M546 Pain in thoracic spine: Secondary | ICD-10-CM

## 2024-03-25 ENCOUNTER — Ambulatory Visit: Admitting: Rehabilitation

## 2024-03-25 ENCOUNTER — Ambulatory Visit: Admitting: Physical Therapy

## 2024-03-25 ENCOUNTER — Encounter: Payer: Self-pay | Admitting: Rehabilitation

## 2024-03-25 ENCOUNTER — Ambulatory Visit (HOSPITAL_BASED_OUTPATIENT_CLINIC_OR_DEPARTMENT_OTHER)

## 2024-03-25 ENCOUNTER — Encounter (HOSPITAL_BASED_OUTPATIENT_CLINIC_OR_DEPARTMENT_OTHER): Payer: Self-pay

## 2024-03-25 DIAGNOSIS — R252 Cramp and spasm: Secondary | ICD-10-CM

## 2024-03-25 DIAGNOSIS — R2689 Other abnormalities of gait and mobility: Secondary | ICD-10-CM | POA: Diagnosis not present

## 2024-03-25 DIAGNOSIS — M546 Pain in thoracic spine: Secondary | ICD-10-CM

## 2024-03-25 DIAGNOSIS — M6281 Muscle weakness (generalized): Secondary | ICD-10-CM

## 2024-03-25 DIAGNOSIS — M5459 Other low back pain: Secondary | ICD-10-CM

## 2024-03-25 NOTE — Therapy (Signed)
 OUTPATIENT PHYSICAL THERAPY TREATMENT   Patient Name: Kerry Wright MRN: [id number] DOB:[birth year], 83 y.o., female Today's Date: 03/25/2024  END OF SESSION:  PT End of Session - 03/25/24 0916     Visit Number 5    Date for PT Re-Evaluation 05/03/24    PT Start Time 0917    PT Stop Time 0957    PT Time Calculation (min) 40 min    Activity Tolerance Patient tolerated treatment well    Behavior During Therapy [facility] for tasks assessed/performed                Past Medical History:  Diagnosis Date   Complication of anesthesia    Headache(784.0)    hx. migraines - less occurring now.   History of kidney stones    remains with kidney stones(not a problem at this time)   Hypertension    Hyperthyroidism    hx. Graves disease "'tx.with radioactive iodine"   Osteoarthritis    PONV (postoperative nausea and vomiting)    Past Surgical History:  Procedure Laterality Date   COLONOSCOPY WITH PROPOFOL  N/A 07/31/2014   Procedure: COLONOSCOPY WITH PROPOFOL ;  Surgeon: Garrett Kallman, MD;  Location: [facility];  Service: Endoscopy;  Laterality: N/A;   TONSILLECTOMY     child   There are no active problems to display for this patient.   REFERRING PROVIDER/PCP: Tressia Fry, MD   REFERRING DIAG: M54.9 (ICD-10-CM) - Back pain, Rt sided thoracic pain with muscle spasms  Rationale for Evaluation and Treatment: Rehabilitation  THERAPY DIAG:  Pain in thoracic spine  Other low back pain  Other abnormalities of gait and mobility  Cramp and spasm  Muscle weakness (generalized)  ONSET DATE: 4 month history   SUBJECTIVE:                                                                                                                                                                                           SUBJECTIVE STATEMENT: I still have around 50 mm spasms per day on  both sides.  I use the salon pas patches as needed.    From Evaluation: Pt present to PT with Rt>Lt  thoracic pain and spasms that began 10/2023. MD has prescribed muscle relaxers and is not sure if it is helping. She walks with a quad cane with significant trunk flexion.     PERTINENT HISTORY:  HTN, Rt hip OA (needs THA), migraines   PAIN: 03/23/24 Are you having pain? Yes: NPRS scale: 0-8/10 Pain location: bil thoracic and low back  Pain description: shooting, aching  Aggravating factors: early morning, bending over to reach, sometimes just  sitting still  Relieving factors: pain patch, Aleve  PRECAUTIONS: Fall  RED FLAGS: None   WEIGHT BEARING RESTRICTIONS: No  FALLS:  Has patient fallen in last 6 months? No  LIVING ENVIRONMENT: Lives with: lives alone Lives in: House/apartment Stairs: Yes: Internal: 12 steps; on right going uplives on main level, doesn't need to go up steps  Has following equipment at home: Chiropractor, Environmental consultant - 4 wheeled, and shower chair  OCCUPATION: retired   PLOF: Independent with community mobility without device and Leisure: watches TV, reading, crossword puzzles, uses hand weights for exercise  PATIENT GOALS: reduce back pain, improve stability   NEXT MD VISIT: May 2025  OBJECTIVE:  Note: Objective measures were completed at Evaluation unless otherwise noted.  DIAGNOSTIC FINDINGS:  None   PATIENT SURVEYS:  03/07/24: Modified Oswestry 18/45 =40% disability   COGNITION: Overall cognitive status: Within functional limits for tasks assessed     SENSATION: WFL   POSTURE: rounded shoulders, forward head, flexed trunk , and weight shift left  PALPATION: Palpable tenderness over Lt>Rt thoracic paraspinals, Lt rhomboids and lumbar paraspinals   LOWER EXTREMITY MMT:    Rt hip rests in ER  4/5, Lt hip 4+/5, bil knees 4+/5   FUNCTIONAL TESTS:  03/08/24: 5 times sit to stand: 20.02 seconds with uncontrolled descent 03/16/24: 3 min walk test 264 feet with 8/10 RPE  GAIT: Distance walked: 50 Assistive device utilized: Quad cane  large base Level of assistance: Modified independence Comments: ER at Rt hip, flexed trunk  TREATMENT DATE:  03/23/24: NuStep: level 3x 10 minutes- PT present to discuss progress Scap squeezes: 5" hold x10 Seated trunk ext, rotation, diagonals x 10 ea holding ball and following with eyes Seated thoracic rotation 3x20 seconds  Seated lumbar ext with magenta powercord x 20 Horizontal abduction and ER with red band x10  Seated shoulder flexion 2# x15, ER x15 Seated rows and ER with red band 2x10   03/23/24: NuStep: level 3x 9 minutes- PT present to discuss progress Scap squeezes: 5" hold x10 Seated trunk ext, rotation, diagonals x 10 ea holding ball and following with eyes Seated thoracic rotation 3x20 seconds  Seated lumbar ext with magenta powercord x 20 Horizontal abduction and ER with red band 2x10  Seated shoulder flexion 2# x15, ER x15  03/18/24: NuStep: level 3x 9 minutes- PT present to discuss progress Scap squeezes: 5" hold x10 Seated trunk ext, rotation, diagonals x 10 ea holding ball and following with eyes Seated thoracic rotation 3x20 seconds  Seated lumbar ext with magenta powercord x 20 Seated rows and ER with red band 2x10  Attempted seated hip IR with ball but poor ability on R  Trigger Point Dry Needling  Subsequent Treatment: Instructions provided previously at initial dry needling treatment.   Patient Verbal Consent Given: Yes Education Handout Provided: Previously Provided Muscles Treated: L rhomoboids and R thoracic multifidi (low throacic) Electrical Stimulation Performed: No Treatment Response/Outcome: Utilized skilled palpation to identify bony landmarks and trigger points.  Able to illicit twitch response and muscle elongation.  Soft tissue mobilization to muscles needled to further promote tissue elongation and decreased pain.         03/16/24: NuStep: level 3x 8 minutes- PT present to discuss progress Seated thoracic rotation 3x20 seconds  Scap  squeezes: 5" hold x10 Hip IR in supine x10 Seated rows and ER with yellow band 2x10  3 min walk test Trigger Point Dry Needling  Initial Treatment: Pt instructed on Dry Needling  rational, procedures, and possible side effects. Pt instructed to expect mild to moderate muscle soreness later in the day and/or into the next day.  Pt instructed in methods to reduce muscle soreness. Pt instructed to continue prescribed HEP. Because Dry Needling was performed over or adjacent to a lung field, pt was educated on S/S of pneumothorax and to seek immediate medical attention should they occur.  Patient was educated on signs and symptoms of infection and other risk factors and advised to seek medical attention should they occur.  Patient verbalized understanding of these instructions and education.   Patient Verbal Consent Given: Yes Education Handout Provided: Yes Muscles Treated: thoracic multifidi and Rt lats along ribs- Pt removed Salanpas patch and had skin irritation under the patch.  PT advised pt and her niece Moira Andrews to apply cortizone cream if needed. Electrical Stimulation Performed: No Treatment Response/Outcome: twitch repsonse and improved tissue mobility  Manual: elongation and release to muscles treated with dry needling    PATIENT EDUCATION:  Education details: [id number], HEP update Person educated: Patient Education method: Explanation, Demonstration, and Handouts Education comprehension: verbalized understanding and returned demonstration  HOME EXERCISE PROGRAM: Access Code: [email or web address] URL: [email or web address] Date: 03/23/2024 Prepared by: Loetta Ringer  Exercises - Supine Hip Internal and External Rotation  - 1 x daily - 7 x weekly - 3 sets - 10 reps - Seated Scapular Retraction  - 5 x daily - 7 x weekly - 1 sets - 10 reps - 5 hold - Seated Thoracic Flexion and Rotation with Arms Crossed  - 3 x daily - 7 x weekly - 1 sets - 3 reps - 10 hold - Seated Correct Posture   - 1 x daily - 7 x weekly - 3 sets - 10 reps - Correct Standing Posture  - 1 x daily - 7 x weekly - 3 sets - 10 reps - Seated Hamstring Stretch  - 3 x daily - 7 x weekly - 1 sets - 3 reps - 20 hold - Supine Lower Trunk Rotation  - 3 x daily - 7 x weekly - 1 sets - 3 reps - 20 hold - Seated Thoracic Extension Arms Overhead  - 1 x daily - 7 x weekly - 1-2 sets - 10 reps - 2-3 sec hold - Seated Trunk Rotation  - 1 x daily - 7 x weekly - 1-2 sets - 10 reps - 2-3 sec hold - Seated Diagonal Chops with Medicine Ball  - 1 x daily - 7 x weekly - 1-2 sets - 10 reps - 2-3 hold - Seated Bilateral Shoulder External Rotation with Resistance  - 1 x daily - 7 x weekly - 2 sets - 10 reps - Seated Shoulder Horizontal Abduction with Resistance  - 1 x daily - 7 x weekly - 2 sets - 10 reps ASSESSMENT:  CLINICAL IMPRESSION: Pt is reporting more pain on the left side lately.  She is looking forward to doing more dry needling and was understanding when she was unable to get it today due to scheduling mishaps.  Continued POC without significant changes.    OBJECTIVE IMPAIRMENTS: Abnormal gait, decreased activity tolerance, decreased balance, decreased mobility, difficulty walking, decreased strength, decreased safety awareness, hypomobility, increased muscle spasms, impaired flexibility, improper body mechanics, postural dysfunction, and pain.   ACTIVITY LIMITATIONS: carrying, lifting, standing, squatting, stairs, transfers, and locomotion level  PARTICIPATION LIMITATIONS: meal prep, cleaning, laundry, driving, community activity, and church  PERSONAL FACTORS: Age, Time since onset of injury/illness/exacerbation, and 1-2 comorbidities: Lt  hip OA, chronic gait abnormality are also affecting patient's functional outcome.   REHAB POTENTIAL: Good  CLINICAL DECISION MAKING: Evolving/moderate complexity  EVALUATION COMPLEXITY: Moderate   GOALS: Goals reviewed with patient? Yes  SHORT TERM GOALS: Target date:  04/05/2024      Be independent in initial HEP Baseline: Goal status: in progress   2.  Perform 3 min walk test to establish baseline and new goal set after Baseline: 264 feet with RPE 8/10 Goal status: MET  3.  Report > or = to 25% reduction in LBP/thoracic pain with ADLs and self-care Baseline: 20% reduction (03/23/24) Goal status: In progress  4.  Improve LE strength to perform stand to sit with controlled descent x 5 reps  Baseline:  Goal status: INITIAL  5.  Report postural corrections at home to reduce lumbar/thoracic stress with standing Baseline: will start using rolling walker to assist(03/16/24) Goal status: In progress    LONG TERM GOALS: Target date: 05/03/2024    Be independent in advanced HEP Baseline:  Goal status: INITIAL  2.  Improve Modified Oswestry to < or = to 13/45 (29% disability) Baseline: 40% disability  Goal status: INITIAL  3.  Report > or = to 50% reduction in LBP/thoracic pain with ADLs and self-care Baseline:  Goal status: INITIAL  4.  Perform sit to stand in < or = to 15 seconds with min UE support to reduce falls risk Baseline: 20 seconds  Goal status: INITIAL  5.  Stand and walk for 6-8 minutes without limitation for improved independence in the community Baseline:  Goal status: INITIAL   PLAN:  PT FREQUENCY: 2x/week  PT DURATION: 8 weeks  PLANNED INTERVENTIONS: 97110-Therapeutic exercises, 97530- Therapeutic activity, 97112- Neuromuscular re-education, 97535- Self Care, 16109- Manual therapy, [phone]- Gait training, [phone]- Canalith repositioning, [id number]- Aquatic Therapy, [phone]- Electrical stimulation (unattended), [phone]- Electrical stimulation (manual), Patient/Family education, Stair training, Taping, Dry Needling, Joint mobilization, Spinal manipulation, Spinal mobilization, Vestibular training, Cryotherapy, and Moist heat.  PLAN FOR NEXT SESSION: assess response to dry needling, work on posture, sit to stand, lumbar and hip  flexibility. Dry needling to thoracic/rhomboids    Luella Sager, PT 03/25/24 11:48 AM  [facility] [address], [address] [place], [place] [address] Phone # [phone] Fax [phone]

## 2024-03-28 ENCOUNTER — Encounter (HOSPITAL_BASED_OUTPATIENT_CLINIC_OR_DEPARTMENT_OTHER): Payer: Self-pay

## 2024-03-28 ENCOUNTER — Ambulatory Visit (HOSPITAL_BASED_OUTPATIENT_CLINIC_OR_DEPARTMENT_OTHER)
Admission: RE | Admit: 2024-03-28 | Discharge: 2024-03-28 | Disposition: A | Discharge status: Home / Self Care | Source: Ambulatory Visit | Attending: Internal Medicine | Admitting: Internal Medicine

## 2024-03-28 DIAGNOSIS — M546 Pain in thoracic spine: Secondary | ICD-10-CM | POA: Diagnosis not present

## 2024-03-28 DIAGNOSIS — K5730 Diverticulosis of large intestine without perforation or abscess without bleeding: Secondary | ICD-10-CM | POA: Insufficient documentation

## 2024-03-28 DIAGNOSIS — N200 Calculus of kidney: Secondary | ICD-10-CM | POA: Diagnosis not present

## 2024-03-28 DIAGNOSIS — K8020 Calculus of gallbladder without cholecystitis without obstruction: Secondary | ICD-10-CM | POA: Diagnosis not present

## 2024-03-28 DIAGNOSIS — D508 Other iron deficiency anemias: Secondary | ICD-10-CM | POA: Insufficient documentation

## 2024-03-28 DIAGNOSIS — M4184 Other forms of scoliosis, thoracic region: Secondary | ICD-10-CM | POA: Diagnosis not present

## 2024-03-28 DIAGNOSIS — I700 Atherosclerosis of aorta: Secondary | ICD-10-CM | POA: Diagnosis not present

## 2024-03-28 DIAGNOSIS — M1611 Unilateral primary osteoarthritis, right hip: Secondary | ICD-10-CM | POA: Insufficient documentation

## 2024-03-28 DIAGNOSIS — M549 Dorsalgia, unspecified: Secondary | ICD-10-CM | POA: Diagnosis not present

## 2024-03-28 DIAGNOSIS — M47814 Spondylosis without myelopathy or radiculopathy, thoracic region: Secondary | ICD-10-CM | POA: Insufficient documentation

## 2024-03-28 DIAGNOSIS — I2510 Atherosclerotic heart disease of native coronary artery without angina pectoris: Secondary | ICD-10-CM | POA: Diagnosis not present

## 2024-03-28 DIAGNOSIS — M5134 Other intervertebral disc degeneration, thoracic region: Secondary | ICD-10-CM | POA: Diagnosis not present

## 2024-03-30 ENCOUNTER — Ambulatory Visit

## 2024-03-30 ENCOUNTER — Other Ambulatory Visit: Payer: Self-pay | Admitting: Internal Medicine

## 2024-03-30 DIAGNOSIS — R2689 Other abnormalities of gait and mobility: Secondary | ICD-10-CM

## 2024-03-30 DIAGNOSIS — M6281 Muscle weakness (generalized): Secondary | ICD-10-CM | POA: Diagnosis not present

## 2024-03-30 DIAGNOSIS — R252 Cramp and spasm: Secondary | ICD-10-CM

## 2024-03-30 DIAGNOSIS — N6311 Unspecified lump in the right breast, upper outer quadrant: Secondary | ICD-10-CM

## 2024-03-30 DIAGNOSIS — M5459 Other low back pain: Secondary | ICD-10-CM | POA: Diagnosis not present

## 2024-03-30 DIAGNOSIS — M546 Pain in thoracic spine: Secondary | ICD-10-CM | POA: Diagnosis not present

## 2024-03-30 NOTE — Therapy (Signed)
 OUTPATIENT PHYSICAL THERAPY TREATMENT   Patient Name: Kerry Wright MRN: [id number] DOB:1940-12-01, 83 y.o., female Today's Date: 03/30/2024  END OF SESSION:  PT End of Session - 03/30/24 1149     Visit Number 6    Date for PT Re-Evaluation 05/03/24    Authorization Type [employer]    Progress Note Due on Visit 10    PT Start Time 1102    PT Stop Time 1147    PT Time Calculation (min) 45 min    Activity Tolerance Patient tolerated treatment well    Behavior During Therapy WFL for tasks assessed/performed                 Past Medical History:  Diagnosis Date   Complication of anesthesia    Headache(784.0)    hx. migraines - less occurring now.   History of kidney stones    remains with kidney stones(not a problem at this time)   Hypertension    Hyperthyroidism    hx. Graves disease "'tx.with radioactive iodine"   Osteoarthritis    PONV (postoperative nausea and vomiting)    Past Surgical History:  Procedure Laterality Date   COLONOSCOPY WITH PROPOFOL  N/A 07/31/2014   Procedure: COLONOSCOPY WITH PROPOFOL ;  Surgeon: Garrett Kallman, MD;  Location: [facility];  Service: Endoscopy;  Laterality: N/A;   TONSILLECTOMY     child   There are no active problems to display for this patient.   REFERRING PROVIDER/PCP: Tressia Fry, MD   REFERRING DIAG: M54.9 (ICD-10-CM) - Back pain, Rt sided thoracic pain with muscle spasms  Rationale for Evaluation and Treatment: Rehabilitation  THERAPY DIAG:  Pain in thoracic spine  Other low back pain  Other abnormalities of gait and mobility  Cramp and spasm  Muscle weakness (generalized)  ONSET DATE: 4 month history   SUBJECTIVE:                                                                                                                                                                                           SUBJECTIVE STATEMENT: I am having fewer spasms in my back.  I had a good day yesterday and today is  not a good day.     From Evaluation: Pt present to PT with Rt>Lt thoracic pain and spasms that began 10/2023. MD has prescribed muscle relaxers and is not sure if it is helping. She walks with a quad cane with significant trunk flexion.     PERTINENT HISTORY:  HTN, Rt hip OA (needs THA), migraines   PAIN: 03/30/24 Are you having pain? Yes: NPRS scale: 0-7/10 Pain location: bil thoracic and  low back  Pain description: shooting, aching  Aggravating factors: early morning, bending over to reach, sometimes just sitting still  Relieving factors: pain patch, Aleve  PRECAUTIONS: Fall  RED FLAGS: None   WEIGHT BEARING RESTRICTIONS: No  FALLS:  Has patient fallen in last 6 months? No  LIVING ENVIRONMENT: Lives with: lives alone Lives in: House/apartment Stairs: Yes: Internal: 12 steps; on right going uplives on main level, doesn't need to go up steps  Has following equipment at home: Chiropractor, Environmental consultant - 4 wheeled, and shower chair  OCCUPATION: retired   PLOF: Independent with community mobility without device and Leisure: watches TV, reading, crossword puzzles, uses hand weights for exercise  PATIENT GOALS: reduce back pain, improve stability   NEXT MD VISIT: May 2025  OBJECTIVE:  Note: Objective measures were completed at Evaluation unless otherwise noted.  DIAGNOSTIC FINDINGS:  None   PATIENT SURVEYS:  03/07/24: Modified Oswestry 18/45 =40% disability   COGNITION: Overall cognitive status: Within functional limits for tasks assessed     SENSATION: WFL   POSTURE: rounded shoulders, forward head, flexed trunk , and weight shift left  PALPATION: Palpable tenderness over Lt>Rt thoracic paraspinals, Lt rhomboids and lumbar paraspinals   LOWER EXTREMITY MMT:    Rt hip rests in ER  4/5, Lt hip 4+/5, bil knees 4+/5   FUNCTIONAL TESTS:  03/08/24: 5 times sit to stand: 20.02 seconds with uncontrolled descent 03/16/24: 3 min walk test 264 feet with 8/10  RPE  GAIT: Distance walked: 50 Assistive device utilized: Quad cane large base Level of assistance: Modified independence Comments: ER at Rt hip, flexed trunk  TREATMENT DATE:  03/30/24: NuStep: level 3x 10 minutes- PT present to discuss progress Scap squeezes: 5" hold x10 Seated trunk ext, rotation, diagonals x 10 ea holding ball and following with eyes-added weighted ball (blue) today Seated hip to hip with blue weighted ball: x20 Seated lumbar ext with magenta powercord x 20 Horizontal abduction and ER with red band x10  Seated press into purple ball with 5" hold: x10 Seated rows and ER with red band 2x10  Trigger Point Dry Needling  Subsequent Treatment: Instructions provided previously at initial dry needling treatment.  Patient Verbal Consent Given: Yes Education Handout Provided: Previously Provided Muscles Treated: Lt rhomoboids and R thoracic and Lt multifidi at thoracolumbar junction  Electrical Stimulation Performed: No Treatment Response/Outcome: Utilized skilled palpation to identify bony landmarks and trigger points.  Able to illicit twitch response and muscle elongation.  Soft tissue mobilization to muscles needled to further promote tissue elongation and decreased pain.     03/23/24: NuStep: level 3x 9 minutes- PT present to discuss progress Scap squeezes: 5" hold x10 Seated trunk ext, rotation, diagonals x 10 ea holding ball and following with eyes Seated thoracic rotation 3x20 seconds  Seated lumbar ext with magenta powercord x 20 Horizontal abduction and ER with red band 2x10  Seated shoulder flexion 2# x15, ER x15  03/18/24: NuStep: level 3x 9 minutes- PT present to discuss progress Scap squeezes: 5" hold x10 Seated trunk ext, rotation, diagonals x 10 ea holding ball and following with eyes Seated thoracic rotation 3x20 seconds  Seated lumbar ext with magenta powercord x 20 Seated rows and ER with red band 2x10  Attempted seated hip IR with ball but poor  ability on R  Trigger Point Dry Needling  Subsequent Treatment: Instructions provided previously at initial dry needling treatment.   Patient Verbal Consent Given: Yes Education Handout Provided: Previously Provided  Muscles Treated: L rhomoboids and R thoracic multifidi (low throacic) Electrical Stimulation Performed: No Treatment Response/Outcome: Utilized skilled palpation to identify bony landmarks and trigger points.  Able to illicit twitch response and muscle elongation.  Soft tissue mobilization to muscles needled to further promote tissue elongation and decreased pain.     PATIENT EDUCATION:  Education details: [id number], HEP update Person educated: Patient Education method: Explanation, Demonstration, and Handouts Education comprehension: verbalized understanding and returned demonstration  HOME EXERCISE PROGRAM: Access Code: [email or web address] URL: [email or web address] Date: 03/23/2024 Prepared by: Loetta Ringer  Exercises - Supine Hip Internal and External Rotation  - 1 x daily - 7 x weekly - 3 sets - 10 reps - Seated Scapular Retraction  - 5 x daily - 7 x weekly - 1 sets - 10 reps - 5 hold - Seated Thoracic Flexion and Rotation with Arms Crossed  - 3 x daily - 7 x weekly - 1 sets - 3 reps - 10 hold - Seated Correct Posture  - 1 x daily - 7 x weekly - 3 sets - 10 reps - Correct Standing Posture  - 1 x daily - 7 x weekly - 3 sets - 10 reps - Seated Hamstring Stretch  - 3 x daily - 7 x weekly - 1 sets - 3 reps - 20 hold - Supine Lower Trunk Rotation  - 3 x daily - 7 x weekly - 1 sets - 3 reps - 20 hold - Seated Thoracic Extension Arms Overhead  - 1 x daily - 7 x weekly - 1-2 sets - 10 reps - 2-3 sec hold - Seated Trunk Rotation  - 1 x daily - 7 x weekly - 1-2 sets - 10 reps - 2-3 sec hold - Seated Diagonal Chops with Medicine Ball  - 1 x daily - 7 x weekly - 1-2 sets - 10 reps - 2-3 hold - Seated Bilateral Shoulder External Rotation with Resistance  - 1 x daily - 7 x weekly - 2  sets - 10 reps - Seated Shoulder Horizontal Abduction with Resistance  - 1 x daily - 7 x weekly - 2 sets - 10 reps ASSESSMENT:  CLINICAL IMPRESSION: Pt reports fewer spasms in her back overall and reports moderate compliance with HEP at home.  She flexes at the trunk in standing and has fixed ER at Rt hip due to chronic pain and compensatory positioning.  Good response to dry needling with improved tissue mobility and twitch response to Lt>Rt thoracolumbar spine.  She advanced to weighted ball with rotations today. She requires max cueing for posture and alignment throughout session yet tolerated all exercises well.  Patient will benefit from skilled PT to address the below impairments and improve overall function.     OBJECTIVE IMPAIRMENTS: Abnormal gait, decreased activity tolerance, decreased balance, decreased mobility, difficulty walking, decreased strength, decreased safety awareness, hypomobility, increased muscle spasms, impaired flexibility, improper body mechanics, postural dysfunction, and pain.   ACTIVITY LIMITATIONS: carrying, lifting, standing, squatting, stairs, transfers, and locomotion level  PARTICIPATION LIMITATIONS: meal prep, cleaning, laundry, driving, community activity, and church  PERSONAL FACTORS: Age, Time since onset of injury/illness/exacerbation, and 1-2 comorbidities: Lt hip OA, chronic gait abnormality are also affecting patient's functional outcome.   REHAB POTENTIAL: Good  CLINICAL DECISION MAKING: Evolving/moderate complexity  EVALUATION COMPLEXITY: Moderate   GOALS: Goals reviewed with patient? Yes  SHORT TERM GOALS: Target date: 04/05/2024      Be independent in initial HEP Baseline: Goal status: in progress   2.  Perform  3 min walk test to establish baseline and new goal set after Baseline: 264 feet with RPE 8/10 Goal status: MET  3.  Report > or = to 25% reduction in LBP/thoracic pain with ADLs and self-care Baseline: 20% reduction  (03/23/24) Goal status: In progress  4.  Improve LE strength to perform stand to sit with controlled descent x 5 reps  Baseline:  Goal status: INITIAL  5.  Report postural corrections at home to reduce lumbar/thoracic stress with standing Baseline: has been using walker to assist (03/30/24) Goal status: In progress    LONG TERM GOALS: Target date: 05/03/2024    Be independent in advanced HEP Baseline:  Goal status: INITIAL  2.  Improve Modified Oswestry to < or = to 13/45 (29% disability) Baseline: 40% disability  Goal status: INITIAL  3.  Report > or = to 50% reduction in LBP/thoracic pain with ADLs and self-care Baseline:  Goal status: INITIAL  4.  Perform sit to stand in < or = to 15 seconds with min UE support to reduce falls risk Baseline: 20 seconds  Goal status: INITIAL  5.  Stand and walk for 6-8 minutes without limitation for improved independence in the community Baseline:  Goal status: INITIAL   PLAN:  PT FREQUENCY: 2x/week  PT DURATION: 8 weeks  PLANNED INTERVENTIONS: 97110-Therapeutic exercises, 97530- Therapeutic activity, 97112- Neuromuscular re-education, 97535- Self Care, 16109- Manual therapy, [phone]- Gait training, [phone]- Canalith repositioning, [id number]- Aquatic Therapy, [phone]- Electrical stimulation (unattended), [phone]- Electrical stimulation (manual), Patient/Family education, Stair training, Taping, Dry Needling, Joint mobilization, Spinal manipulation, Spinal mobilization, Vestibular training, Cryotherapy, and Moist heat.  PLAN FOR NEXT SESSION: assess response to dry needling, work on posture, sit to stand, lumbar and hip flexibility. Test    Luella Sager, PT 03/30/24 11:49 AM  [facility] [address], [address] [place], [place] [address] Phone # [phone] Fax [phone]

## 2024-03-31 DIAGNOSIS — D649 Anemia, unspecified: Secondary | ICD-10-CM | POA: Diagnosis not present

## 2024-03-31 DIAGNOSIS — N200 Calculus of kidney: Secondary | ICD-10-CM | POA: Diagnosis not present

## 2024-03-31 NOTE — Therapy (Incomplete)
 OUTPATIENT PHYSICAL THERAPY TREATMENT   Patient Name: Kerry Wright MRN: [id number] DOB:[birth year], 83 y.o., female Today's Date: 03/31/2024  END OF SESSION:        Past Medical History:  Diagnosis Date   Complication of anesthesia    Headache(784.0)    hx. migraines - less occurring now.   History of kidney stones    remains with kidney stones(not a problem at this time)   Hypertension    Hyperthyroidism    hx. Graves disease "'tx.with radioactive iodine"   Osteoarthritis    PONV (postoperative nausea and vomiting)    Past Surgical History:  Procedure Laterality Date   COLONOSCOPY WITH PROPOFOL  N/A 07/31/2014   Procedure: COLONOSCOPY WITH PROPOFOL ;  Surgeon: Garrett Kallman, MD;  Location: [facility];  Service: Endoscopy;  Laterality: N/A;   TONSILLECTOMY     child   There are no active problems to display for this patient.   REFERRING PROVIDER/PCP: Tressia Fry, MD   REFERRING DIAG: M54.9 (ICD-10-CM) - Back pain, Rt sided thoracic pain with muscle spasms  Rationale for Evaluation and Treatment: Rehabilitation  THERAPY DIAG:  No diagnosis found.  ONSET DATE: 4 month history   SUBJECTIVE:                                                                                                                                                                                           SUBJECTIVE STATEMENT: ***   From Evaluation: Pt present to PT with Rt>Lt thoracic pain and spasms that began 10/2023. MD has prescribed muscle relaxers and is not sure if it is helping. She walks with a quad cane with significant trunk flexion.     PERTINENT HISTORY:  HTN, Rt hip OA (needs THA), migraines   PAIN: 03/30/24 Are you having pain? Yes: NPRS scale: 0-7/10 Pain location: bil thoracic and low back  Pain description: shooting, aching  Aggravating factors: early morning, bending over to reach, sometimes just sitting still  Relieving factors: pain patch, Aleve  PRECAUTIONS:  Fall  RED FLAGS: None   WEIGHT BEARING RESTRICTIONS: No  FALLS:  Has patient fallen in last 6 months? No  LIVING ENVIRONMENT: Lives with: lives alone Lives in: House/apartment Stairs: Yes: Internal: 12 steps; on right going uplives on main level, doesn't need to go up steps  Has following equipment at home: Chiropractor, Environmental consultant - 4 wheeled, and shower chair  OCCUPATION: retired   PLOF: Independent with community mobility without device and Leisure: watches TV, reading, crossword puzzles, uses hand weights for exercise  PATIENT GOALS: reduce back pain, improve stability   NEXT MD VISIT: May 2025  OBJECTIVE:  Note: Objective measures were completed at Evaluation unless otherwise noted.  DIAGNOSTIC FINDINGS:  None   PATIENT SURVEYS:  03/07/24: Modified Oswestry 18/45 =40% disability   COGNITION: Overall cognitive status: Within functional limits for tasks assessed     SENSATION: WFL   POSTURE: rounded shoulders, forward head, flexed trunk , and weight shift left  PALPATION: Palpable tenderness over Lt>Rt thoracic paraspinals, Lt rhomboids and lumbar paraspinals   LOWER EXTREMITY MMT:    Rt hip rests in ER  4/5, Lt hip 4+/5, bil knees 4+/5   FUNCTIONAL TESTS:  03/08/24: 5 times sit to stand: 20.02 seconds with uncontrolled descent 03/16/24: 3 min walk test 264 feet with 8/10 RPE  GAIT: Distance walked: 50 Assistive device utilized: Quad cane large base Level of assistance: Modified independence Comments: ER at Rt hip, flexed trunk  TREATMENT DATE:  04/01/24: NuStep: level 3x 10 minutes- PT present to discuss progress Scap squeezes: 5" hold x10 Seated trunk ext, rotation, diagonals x 10 ea holding ball and following with eyes-added weighted ball (blue) today Seated hip to hip with blue weighted ball: x20 Seated lumbar ext with magenta powercord x 20 Horizontal abduction and ER with red band x10  Seated press into purple ball with 5" hold: x10 Seated  rows and ER with red band 2x10  Seated shoulder flexion 2# x15, ER x15  03/30/24: NuStep: level 3x 10 minutes- PT present to discuss progress Scap squeezes: 5" hold x10 Seated trunk ext, rotation, diagonals x 10 ea holding ball and following with eyes-added weighted ball (blue) today Seated hip to hip with blue weighted ball: x20 Seated lumbar ext with magenta powercord x 20 Horizontal abduction and ER with red band x10  Seated press into purple ball with 5" hold: x10 Seated rows and ER with red band 2x10  Trigger Point Dry Needling  Subsequent Treatment: Instructions provided previously at initial dry needling treatment.  Patient Verbal Consent Given: Yes Education Handout Provided: Previously Provided Muscles Treated: Lt rhomoboids and R thoracic and Lt multifidi at thoracolumbar junction  Electrical Stimulation Performed: No Treatment Response/Outcome: Utilized skilled palpation to identify bony landmarks and trigger points.  Able to illicit twitch response and muscle elongation.  Soft tissue mobilization to muscles needled to further promote tissue elongation and decreased pain.     03/23/24: NuStep: level 3x 9 minutes- PT present to discuss progress Scap squeezes: 5" hold x10 Seated trunk ext, rotation, diagonals x 10 ea holding ball and following with eyes Seated thoracic rotation 3x20 seconds  Seated lumbar ext with magenta powercord x 20 Horizontal abduction and ER with red band 2x10  Seated shoulder flexion 2# x15, ER x15  03/18/24: NuStep: level 3x 9 minutes- PT present to discuss progress Scap squeezes: 5" hold x10 Seated trunk ext, rotation, diagonals x 10 ea holding ball and following with eyes Seated thoracic rotation 3x20 seconds  Seated lumbar ext with magenta powercord x 20 Seated rows and ER with red band 2x10  Attempted seated hip IR with ball but poor ability on R  Trigger Point Dry Needling  Subsequent Treatment: Instructions provided previously at initial dry  needling treatment.   Patient Verbal Consent Given: Yes Education Handout Provided: Previously Provided Muscles Treated: L rhomoboids and R thoracic multifidi (low throacic) Electrical Stimulation Performed: No Treatment Response/Outcome: Utilized skilled palpation to identify bony landmarks and trigger points.  Able to illicit twitch response and muscle elongation.  Soft tissue mobilization to muscles needled to further promote tissue elongation and decreased  pain.     PATIENT EDUCATION:  Education details: [email or web address], HEP update Person educated: Patient Education method: Explanation, Demonstration, and Handouts Education comprehension: verbalized understanding and returned demonstration  HOME EXERCISE PROGRAM: Access Code: [email or web address] URL: [email or web address] Date: 03/23/2024 Prepared by: Loetta Ringer  Exercises - Supine Hip Internal and External Rotation  - 1 x daily - 7 x weekly - 3 sets - 10 reps - Seated Scapular Retraction  - 5 x daily - 7 x weekly - 1 sets - 10 reps - 5 hold - Seated Thoracic Flexion and Rotation with Arms Crossed  - 3 x daily - 7 x weekly - 1 sets - 3 reps - 10 hold - Seated Correct Posture  - 1 x daily - 7 x weekly - 3 sets - 10 reps - Correct Standing Posture  - 1 x daily - 7 x weekly - 3 sets - 10 reps - Seated Hamstring Stretch  - 3 x daily - 7 x weekly - 1 sets - 3 reps - 20 hold - Supine Lower Trunk Rotation  - 3 x daily - 7 x weekly - 1 sets - 3 reps - 20 hold - Seated Thoracic Extension Arms Overhead  - 1 x daily - 7 x weekly - 1-2 sets - 10 reps - 2-3 sec hold - Seated Trunk Rotation  - 1 x daily - 7 x weekly - 1-2 sets - 10 reps - 2-3 sec hold - Seated Diagonal Chops with Medicine Ball  - 1 x daily - 7 x weekly - 1-2 sets - 10 reps - 2-3 hold - Seated Bilateral Shoulder External Rotation with Resistance  - 1 x daily - 7 x weekly - 2 sets - 10 reps - Seated Shoulder Horizontal Abduction with Resistance  - 1 x daily - 7 x weekly - 2 sets - 10  reps ASSESSMENT:  CLINICAL IMPRESSION: ***    OBJECTIVE IMPAIRMENTS: Abnormal gait, decreased activity tolerance, decreased balance, decreased mobility, difficulty walking, decreased strength, decreased safety awareness, hypomobility, increased muscle spasms, impaired flexibility, improper body mechanics, postural dysfunction, and pain.   ACTIVITY LIMITATIONS: carrying, lifting, standing, squatting, stairs, transfers, and locomotion level  PARTICIPATION LIMITATIONS: meal prep, cleaning, laundry, driving, community activity, and church  PERSONAL FACTORS: Age, Time since onset of injury/illness/exacerbation, and 1-2 comorbidities: Lt hip OA, chronic gait abnormality are also affecting patient's functional outcome.   REHAB POTENTIAL: Good  CLINICAL DECISION MAKING: Evolving/moderate complexity  EVALUATION COMPLEXITY: Moderate   GOALS: Goals reviewed with patient? Yes  SHORT TERM GOALS: Target date: 04/05/2024      Be independent in initial HEP Baseline: Goal status: in progress   2.  Perform 3 min walk test to establish baseline and new goal set after Baseline: 264 feet with RPE 8/10 Goal status: MET  3.  Report > or = to 25% reduction in LBP/thoracic pain with ADLs and self-care Baseline: 20% reduction (03/23/24) Goal status: In progress  4.  Improve LE strength to perform stand to sit with controlled descent x 5 reps  Baseline:  Goal status: INITIAL  5.  Report postural corrections at home to reduce lumbar/thoracic stress with standing Baseline: has been using walker to assist (03/30/24) Goal status: In progress    LONG TERM GOALS: Target date: 05/03/2024    Be independent in advanced HEP Baseline:  Goal status: INITIAL  2.  Improve Modified Oswestry to < or = to 13/45 (29% disability) Baseline: 40% disability  Goal status: INITIAL  3.  Report > or =  to 50% reduction in LBP/thoracic pain with ADLs and self-care Baseline:  Goal status: INITIAL  4.  Perform  sit to stand in < or = to 15 seconds with min UE support to reduce falls risk Baseline: 20 seconds  Goal status: INITIAL  5.  Stand and walk for 6-8 minutes without limitation for improved independence in the community Baseline:  Goal status: INITIAL   PLAN:  PT FREQUENCY: 2x/week  PT DURATION: 8 weeks  PLANNED INTERVENTIONS: 97110-Therapeutic exercises, 97530- Therapeutic activity, 97112- Neuromuscular re-education, 97535- Self Care, 16109- Manual therapy, [phone]- Gait training, [phone]- Canalith repositioning, [id number]- Aquatic Therapy, [phone]- Electrical stimulation (unattended), [phone]- Electrical stimulation (manual), Patient/Family education, Stair training, Taping, Dry Needling, Joint mobilization, Spinal manipulation, Spinal mobilization, Vestibular training, Cryotherapy, and Moist heat.  PLAN FOR NEXT SESSION: assess response to dry needling, work on posture, sit to stand, lumbar and hip flexibility. Test    Jinx Mourning, PT  03/31/24 7:34 PM  [facility] [address] [place], [place] [address] Phone # [phone] Fax [phone]

## 2024-04-01 ENCOUNTER — Encounter: Payer: Self-pay | Admitting: Physical Therapy

## 2024-04-01 ENCOUNTER — Ambulatory Visit: Admitting: Physical Therapy

## 2024-04-01 DIAGNOSIS — M546 Pain in thoracic spine: Secondary | ICD-10-CM | POA: Diagnosis not present

## 2024-04-01 DIAGNOSIS — R252 Cramp and spasm: Secondary | ICD-10-CM | POA: Diagnosis not present

## 2024-04-01 DIAGNOSIS — M5459 Other low back pain: Secondary | ICD-10-CM | POA: Diagnosis not present

## 2024-04-01 DIAGNOSIS — M6281 Muscle weakness (generalized): Secondary | ICD-10-CM | POA: Diagnosis not present

## 2024-04-01 DIAGNOSIS — R2689 Other abnormalities of gait and mobility: Secondary | ICD-10-CM

## 2024-04-01 NOTE — Therapy (Signed)
 OUTPATIENT PHYSICAL THERAPY TREATMENT   Patient Name: Kerry Wright MRN: [id number] DOB:26-Dec-1940, 83 y.o., female Today's Date: 04/01/2024  END OF SESSION:  PT End of Session - 04/01/24 1131     Visit Number 7    Date for PT Re-Evaluation 05/03/24    Authorization Type [employer]    Progress Note Due on Visit 10    PT Start Time 1016    PT Stop Time 1100    PT Time Calculation (min) 44 min    Activity Tolerance Patient tolerated treatment well    Behavior During Therapy WFL for tasks assessed/performed                  Past Medical History:  Diagnosis Date   Complication of anesthesia    Headache(784.0)    hx. migraines - less occurring now.   History of kidney stones    remains with kidney stones(not a problem at this time)   Hypertension    Hyperthyroidism    hx. Graves disease "'tx.with radioactive iodine"   Osteoarthritis    PONV (postoperative nausea and vomiting)    Past Surgical History:  Procedure Laterality Date   COLONOSCOPY WITH PROPOFOL  N/A 07/31/2014   Procedure: COLONOSCOPY WITH PROPOFOL ;  Surgeon: Garrett Kallman, MD;  Location: [facility];  Service: Endoscopy;  Laterality: N/A;   TONSILLECTOMY     child   There are no active problems to display for this patient.   REFERRING PROVIDER/PCP: Tressia Fry, MD   REFERRING DIAG: M54.9 (ICD-10-CM) - Back pain, Rt sided thoracic pain with muscle spasms  Rationale for Evaluation and Treatment: Rehabilitation  THERAPY DIAG:  Pain in thoracic spine  Other low back pain  Other abnormalities of gait and mobility  Cramp and spasm  Muscle weakness (generalized)  ONSET DATE: 4 month history   SUBJECTIVE:                                                                                                                                                                                           SUBJECTIVE STATEMENT: I am doing okay today. My left side giving me problems today.   From  Evaluation: Pt present to PT with Rt>Lt thoracic pain and spasms that began 10/2023. MD has prescribed muscle relaxers and is not sure if it is helping. She walks with a quad cane with significant trunk flexion.     PERTINENT HISTORY:  HTN, Rt hip OA (needs THA), migraines   PAIN: 03/30/24 Are you having pain? Yes: NPRS scale: 0-7/10 Pain location: bil thoracic and low back  Pain description: shooting, aching  Aggravating factors: early  morning, bending over to reach, sometimes just sitting still  Relieving factors: pain patch, Aleve  PRECAUTIONS: Fall  RED FLAGS: None   WEIGHT BEARING RESTRICTIONS: No  FALLS:  Has patient fallen in last 6 months? No  LIVING ENVIRONMENT: Lives with: lives alone Lives in: House/apartment Stairs: Yes: Internal: 12 steps; on right going uplives on main level, doesn't need to go up steps  Has following equipment at home: Chiropractor, Environmental consultant - 4 wheeled, and shower chair  OCCUPATION: retired   PLOF: Independent with community mobility without device and Leisure: watches TV, reading, crossword puzzles, uses hand weights for exercise  PATIENT GOALS: reduce back pain, improve stability   NEXT MD VISIT: May 2025  OBJECTIVE:  Note: Objective measures were completed at Evaluation unless otherwise noted.  DIAGNOSTIC FINDINGS:  None   PATIENT SURVEYS:  03/07/24: Modified Oswestry 18/45 =40% disability   COGNITION: Overall cognitive status: Within functional limits for tasks assessed     SENSATION: WFL   POSTURE: rounded shoulders, forward head, flexed trunk , and weight shift left  PALPATION: Palpable tenderness over Lt>Rt thoracic paraspinals, Lt rhomboids and lumbar paraspinals   LOWER EXTREMITY MMT:    Rt hip rests in ER  4/5, Lt hip 4+/5, bil knees 4+/5   FUNCTIONAL TESTS:  03/08/24: 5 times sit to stand: 20.02 seconds with uncontrolled descent 03/16/24: 3 min walk test 264 feet with 8/10 RPE  GAIT: Distance walked:  50 Assistive device utilized: Quad cane large base Level of assistance: Modified independence Comments: ER at Rt hip, flexed trunk  TREATMENT DATE:  04/01/24: NuStep: level 3x 10 minutes- PT present to discuss progress Seated shoulder flexion 2# DB 2 x 10 Scap squeezes: 5" hold x10 Seated trunk ext, rotation, diagonals x 10 ea holding ball and following with eyes-added weighted ball (blue)  Seated hip to ear with blue weighted ball: x20 Seated lumbar ext with magenta powercord x 20 Horizontal abduction and ER with red band 2x10  Seated press into purple ball with 5" hold: x15 Seated rows with red band 2x10     03/30/24: NuStep: level 3x 10 minutes- PT present to discuss progress Scap squeezes: 5" hold x10 Seated trunk ext, rotation, diagonals x 10 ea holding ball and following with eyes-added weighted ball (blue) today Seated hip to hip with blue weighted ball: x20 Seated lumbar ext with magenta powercord x 20 Horizontal abduction and ER with red band x10  Seated press into purple ball with 5" hold: x10 Seated rows and ER with red band 2x10  Trigger Point Dry Needling  Subsequent Treatment: Instructions provided previously at initial dry needling treatment.  Patient Verbal Consent Given: Yes Education Handout Provided: Previously Provided Muscles Treated: Lt rhomoboids and R thoracic and Lt multifidi at thoracolumbar junction  Electrical Stimulation Performed: No Treatment Response/Outcome: Utilized skilled palpation to identify bony landmarks and trigger points.  Able to illicit twitch response and muscle elongation.  Soft tissue mobilization to muscles needled to further promote tissue elongation and decreased pain.     03/23/24: NuStep: level 3x 9 minutes- PT present to discuss progress Scap squeezes: 5" hold x10 Seated trunk ext, rotation, diagonals x 10 ea holding ball and following with eyes Seated thoracic rotation 3x20 seconds  Seated lumbar ext with magenta powercord x  20 Horizontal abduction and ER with red band 2x10  Seated shoulder flexion 2# x15, ER x15  PATIENT EDUCATION:  Education details: [email or web address], HEP update Person educated: Patient Education method: Explanation, Demonstration, and  Handouts Education comprehension: verbalized understanding and returned demonstration  HOME EXERCISE PROGRAM: Access Code: [email or web address] URL: [email or web address] Date: 03/23/2024 Prepared by: Loetta Ringer  Exercises - Supine Hip Internal and External Rotation  - 1 x daily - 7 x weekly - 3 sets - 10 reps - Seated Scapular Retraction  - 5 x daily - 7 x weekly - 1 sets - 10 reps - 5 hold - Seated Thoracic Flexion and Rotation with Arms Crossed  - 3 x daily - 7 x weekly - 1 sets - 3 reps - 10 hold - Seated Correct Posture  - 1 x daily - 7 x weekly - 3 sets - 10 reps - Correct Standing Posture  - 1 x daily - 7 x weekly - 3 sets - 10 reps - Seated Hamstring Stretch  - 3 x daily - 7 x weekly - 1 sets - 3 reps - 20 hold - Supine Lower Trunk Rotation  - 3 x daily - 7 x weekly - 1 sets - 3 reps - 20 hold - Seated Thoracic Extension Arms Overhead  - 1 x daily - 7 x weekly - 1-2 sets - 10 reps - 2-3 sec hold - Seated Trunk Rotation  - 1 x daily - 7 x weekly - 1-2 sets - 10 reps - 2-3 sec hold - Seated Diagonal Chops with Medicine Ball  - 1 x daily - 7 x weekly - 1-2 sets - 10 reps - 2-3 hold - Seated Bilateral Shoulder External Rotation with Resistance  - 1 x daily - 7 x weekly - 2 sets - 10 reps - Seated Shoulder Horizontal Abduction with Resistance  - 1 x daily - 7 x weekly - 2 sets - 10 reps ASSESSMENT:  CLINICAL IMPRESSION: Laquisa verbalized the dry needling was helpful last treatment session. She had some left back discomfort today, but she was able to participate in all exercises today. Patient required occasional verbal cues for upright posture while performing exercises. Demonstrated upper body exercises she can do at home to maintain strength. Patient will  benefit from skilled PT to address the below impairments and improve overall function.     OBJECTIVE IMPAIRMENTS: Abnormal gait, decreased activity tolerance, decreased balance, decreased mobility, difficulty walking, decreased strength, decreased safety awareness, hypomobility, increased muscle spasms, impaired flexibility, improper body mechanics, postural dysfunction, and pain.   ACTIVITY LIMITATIONS: carrying, lifting, standing, squatting, stairs, transfers, and locomotion level  PARTICIPATION LIMITATIONS: meal prep, cleaning, laundry, driving, community activity, and church  PERSONAL FACTORS: Age, Time since onset of injury/illness/exacerbation, and 1-2 comorbidities: Lt hip OA, chronic gait abnormality are also affecting patient's functional outcome.   REHAB POTENTIAL: Good  CLINICAL DECISION MAKING: Evolving/moderate complexity  EVALUATION COMPLEXITY: Moderate   GOALS: Goals reviewed with patient? Yes  SHORT TERM GOALS: Target date: 04/05/2024      Be independent in initial HEP Baseline: Goal status: in progress   2.  Perform 3 min walk test to establish baseline and new goal set after Baseline: 264 feet with RPE 8/10 Goal status: MET  3.  Report > or = to 25% reduction in LBP/thoracic pain with ADLs and self-care Baseline: 20% reduction (03/23/24) Goal status: In progress  4.  Improve LE strength to perform stand to sit with controlled descent x 5 reps  Baseline:  Goal status: INITIAL  5.  Report postural corrections at home to reduce lumbar/thoracic stress with standing Baseline: has been using walker to assist (03/30/24) Goal status: In progress    LONG TERM  GOALS: Target date: 05/03/2024    Be independent in advanced HEP Baseline:  Goal status: INITIAL  2.  Improve Modified Oswestry to < or = to 13/45 (29% disability) Baseline: 40% disability  Goal status: INITIAL  3.  Report > or = to 50% reduction in LBP/thoracic pain with ADLs and  self-care Baseline:  Goal status: INITIAL  4.  Perform sit to stand in < or = to 15 seconds with min UE support to reduce falls risk Baseline: 20 seconds  Goal status: INITIAL  5.  Stand and walk for 6-8 minutes without limitation for improved independence in the community Baseline:  Goal status: INITIAL   PLAN:  PT FREQUENCY: 2x/week  PT DURATION: 8 weeks  PLANNED INTERVENTIONS: 97110-Therapeutic exercises, 97530- Therapeutic activity, 97112- Neuromuscular re-education, 97535- Self Care, 16109- Manual therapy, [phone]- Gait training, [phone]- Canalith repositioning, [id number]- Aquatic Therapy, [phone]- Electrical stimulation (unattended), [phone]- Electrical stimulation (manual), Patient/Family education, Stair training, Taping, Dry Needling, Joint mobilization, Spinal manipulation, Spinal mobilization, Vestibular training, Cryotherapy, and Moist heat.  PLAN FOR NEXT SESSION: DN as indicated, work on posture, sit to stand, lumbar and hip flexibility. Test    Penelope Bowie, PT 04/01/24 11:31 AM [facility] [address], [address] [place], [place] [address] Phone # [phone] Fax [phone]

## 2024-04-06 ENCOUNTER — Ambulatory Visit

## 2024-04-06 DIAGNOSIS — M546 Pain in thoracic spine: Secondary | ICD-10-CM

## 2024-04-06 DIAGNOSIS — R252 Cramp and spasm: Secondary | ICD-10-CM | POA: Diagnosis not present

## 2024-04-06 DIAGNOSIS — R2689 Other abnormalities of gait and mobility: Secondary | ICD-10-CM

## 2024-04-06 DIAGNOSIS — M6281 Muscle weakness (generalized): Secondary | ICD-10-CM

## 2024-04-06 DIAGNOSIS — M5459 Other low back pain: Secondary | ICD-10-CM | POA: Diagnosis not present

## 2024-04-06 NOTE — Therapy (Signed)
 OUTPATIENT PHYSICAL THERAPY TREATMENT   Patient Name: Kerry Wright MRN: [id number] DOB:Oct 12, 1941, 83 y.o., female Today's Date: 04/06/2024  END OF SESSION:  PT End of Session - 04/06/24 1104     Visit Number 8    Date for PT Re-Evaluation 05/03/24    Authorization Type [employer]    Progress Note Due on Visit 10    PT Start Time 1020    PT Stop Time 1103    PT Time Calculation (min) 43 min    Activity Tolerance Patient tolerated treatment well    Behavior During Therapy WFL for tasks assessed/performed                   Past Medical History:  Diagnosis Date   Complication of anesthesia    Headache(784.0)    hx. migraines - less occurring now.   History of kidney stones    remains with kidney stones(not a problem at this time)   Hypertension    Hyperthyroidism    hx. Graves disease "'tx.with radioactive iodine"   Osteoarthritis    PONV (postoperative nausea and vomiting)    Past Surgical History:  Procedure Laterality Date   COLONOSCOPY WITH PROPOFOL  N/A 07/31/2014   Procedure: COLONOSCOPY WITH PROPOFOL ;  Surgeon: Garrett Kallman, MD;  Location: [facility];  Service: Endoscopy;  Laterality: N/A;   TONSILLECTOMY     child   There are no active problems to display for this patient.   REFERRING PROVIDER/PCP: Tressia Fry, MD   REFERRING DIAG: M54.9 (ICD-10-CM) - Back pain, Rt sided thoracic pain with muscle spasms  Rationale for Evaluation and Treatment: Rehabilitation  THERAPY DIAG:  Pain in thoracic spine  Other low back pain  Other abnormalities of gait and mobility  Cramp and spasm  Muscle weakness (generalized)  ONSET DATE: 4 month history   SUBJECTIVE:                                                                                                                                                                                           SUBJECTIVE STATEMENT: 50% less frequency of spasms in my back.  Intensity is the same.  I am  working on my posture.  I need to get my back fixed before I get my hip looked at.    From Evaluation: Pt present to PT with Rt>Lt thoracic pain and spasms that began 10/2023. MD has prescribed muscle relaxers and is not sure if it is helping. She walks with a quad cane with significant trunk flexion.     PERTINENT HISTORY:  HTN, Rt hip OA (needs THA), migraines   PAIN:  04/06/24 Are you having pain? Yes: NPRS scale: 0-7/10 Pain location: bil thoracic and low back  Pain description: shooting, aching  Aggravating factors: early morning, bending over to reach, sometimes just sitting still  Relieving factors: pain patch, Aleve  PRECAUTIONS: Fall  RED FLAGS: None   WEIGHT BEARING RESTRICTIONS: No  FALLS:  Has patient fallen in last 6 months? No  LIVING ENVIRONMENT: Lives with: lives alone Lives in: House/apartment Stairs: Yes: Internal: 12 steps; on right going uplives on main level, doesn't need to go up steps  Has following equipment at home: Chiropractor, Environmental consultant - 4 wheeled, and shower chair  OCCUPATION: retired   PLOF: Independent with community mobility without device and Leisure: watches TV, reading, crossword puzzles, uses hand weights for exercise  PATIENT GOALS: reduce back pain, improve stability   NEXT MD VISIT: May 2025  OBJECTIVE:  Note: Objective measures were completed at Evaluation unless otherwise noted.  DIAGNOSTIC FINDINGS:  None   PATIENT SURVEYS:  03/07/24: Modified Oswestry 18/45 =40% disability   COGNITION: Overall cognitive status: Within functional limits for tasks assessed     SENSATION: WFL   POSTURE: rounded shoulders, forward head, flexed trunk , and weight shift left  PALPATION: Palpable tenderness over Lt>Rt thoracic paraspinals, Lt rhomboids and lumbar paraspinals   LOWER EXTREMITY MMT:    Rt hip rests in ER  4/5, Lt hip 4+/5, bil knees 4+/5   FUNCTIONAL TESTS:  04/06/24: 5x sit to stand: 20.3 seconds with controlled  descent  3 min walk test: 255 feet with 7/10 RPE  03/08/24: 5 times sit to stand: 20.02 seconds with uncontrolled descent 03/16/24: 3 min walk test 264 feet with 8/10 RPE  GAIT: Distance walked: 50 Assistive device utilized: Quad cane large base Level of assistance: Modified independence Comments: ER at Rt hip, flexed trunk  TREATMENT DATE:   04/06/24: NuStep: level 3x 10 minutes- PT present to discuss progress Seated shoulder flexion and scaption 2# DB 2 x 10 Seated trunk ext, rotation, diagonals x 10 ea holding ball and following with eyes-added weighted ball (yellow)  Seated hip to ear with yellow weighted ball: x20 Seated lumbar ext with magenta powercord x 20 Horizontal abduction and ER with red band 2x10  Seated press into purple ball with 5" hold: x15 Seated rows with red band 2x10  3 min walk    04/01/24: NuStep: level 3x 10 minutes- PT present to discuss progress Seated shoulder flexion 2# DB 2 x 10 Scap squeezes: 5" hold x10 Seated trunk ext, rotation, diagonals x 10 ea holding ball and following with eyes-added weighted ball (blue)  Seated hip to ear with blue weighted ball: x20 Seated lumbar ext with magenta powercord x 20 Horizontal abduction and ER with red band 2x10  Seated press into purple ball with 5" hold: x15 Seated rows with red band 2x10     03/30/24: NuStep: level 3x 10 minutes- PT present to discuss progress Scap squeezes: 5" hold x10 Seated trunk ext, rotation, diagonals x 10 ea holding ball and following with eyes-added weighted ball (blue) today Seated hip to hip with blue weighted ball: x20 Seated lumbar ext with magenta powercord x 20 Horizontal abduction and ER with red band x10  Seated press into purple ball with 5" hold: x10 Seated rows and ER with red band 2x10  Trigger Point Dry Needling  Subsequent Treatment: Instructions provided previously at initial dry needling treatment.  Patient Verbal Consent Given: Yes Education Handout  Provided: Previously Provided Muscles  Treated: Lt rhomoboids and R thoracic and Lt multifidi at thoracolumbar junction  Electrical Stimulation Performed: No Treatment Response/Outcome: Utilized skilled palpation to identify bony landmarks and trigger points.  Able to illicit twitch response and muscle elongation.  Soft tissue mobilization to muscles needled to further promote tissue elongation and decreased pain.     PATIENT EDUCATION:  Education details: [id number], HEP update Person educated: Patient Education method: Explanation, Demonstration, and Handouts Education comprehension: verbalized understanding and returned demonstration  HOME EXERCISE PROGRAM: Access Code: [email or web address] URL: [email or web address] Date: 03/23/2024 Prepared by: Loetta Ringer  Exercises - Supine Hip Internal and External Rotation  - 1 x daily - 7 x weekly - 3 sets - 10 reps - Seated Scapular Retraction  - 5 x daily - 7 x weekly - 1 sets - 10 reps - 5 hold - Seated Thoracic Flexion and Rotation with Arms Crossed  - 3 x daily - 7 x weekly - 1 sets - 3 reps - 10 hold - Seated Correct Posture  - 1 x daily - 7 x weekly - 3 sets - 10 reps - Correct Standing Posture  - 1 x daily - 7 x weekly - 3 sets - 10 reps - Seated Hamstring Stretch  - 3 x daily - 7 x weekly - 1 sets - 3 reps - 20 hold - Supine Lower Trunk Rotation  - 3 x daily - 7 x weekly - 1 sets - 3 reps - 20 hold - Seated Thoracic Extension Arms Overhead  - 1 x daily - 7 x weekly - 1-2 sets - 10 reps - 2-3 sec hold - Seated Trunk Rotation  - 1 x daily - 7 x weekly - 1-2 sets - 10 reps - 2-3 sec hold - Seated Diagonal Chops with Medicine Ball  - 1 x daily - 7 x weekly - 1-2 sets - 10 reps - 2-3 hold - Seated Bilateral Shoulder External Rotation with Resistance  - 1 x daily - 7 x weekly - 2 sets - 10 reps - Seated Shoulder Horizontal Abduction with Resistance  - 1 x daily - 7 x weekly - 2 sets - 10 reps ASSESSMENT:  CLINICAL IMPRESSION: Pt with 50%  reduced frequency of muscle spasms in back. No change in intensity.  PT has encouraged pt to see orthopedic MD regarding her Rt hip.  It is flexed and ER, fixed in this position.  PT educated pt regarding the impact of this on her ability to stand upright.  She is working on her posture at home and is challenged due to chronicity of her forward flexed posture. Sit to stand in improved with improved control with descent.  She is also able to maintain upright posture in sitting with fewer verbal cues throughout session.  Patient will benefit from skilled PT to address the below impairments and improve overall function.     OBJECTIVE IMPAIRMENTS: Abnormal gait, decreased activity tolerance, decreased balance, decreased mobility, difficulty walking, decreased strength, decreased safety awareness, hypomobility, increased muscle spasms, impaired flexibility, improper body mechanics, postural dysfunction, and pain.   ACTIVITY LIMITATIONS: carrying, lifting, standing, squatting, stairs, transfers, and locomotion level  PARTICIPATION LIMITATIONS: meal prep, cleaning, laundry, driving, community activity, and church  PERSONAL FACTORS: Age, Time since onset of injury/illness/exacerbation, and 1-2 comorbidities: Lt hip OA, chronic gait abnormality are also affecting patient's functional outcome.   REHAB POTENTIAL: Good  CLINICAL DECISION MAKING: Evolving/moderate complexity  EVALUATION COMPLEXITY: Moderate   GOALS: Goals reviewed with patient? Yes  SHORT TERM GOALS:  Target date: 04/05/2024      Be independent in initial HEP Baseline: Goal status: in progress   2.  Perform 3 min walk test to establish baseline and new goal set after Baseline: 255 feet with RPE 7/10 (04/06/24) Goal status: MET  3.  Report > or = to 25% reduction in LBP/thoracic pain with ADLs and self-care Baseline: 50% reduction in frequency (04/06/24) Goal status: In progress  4.  Improve LE strength to perform stand to sit  with controlled descent x 5 reps  Baseline: able to do this with controlled descent, on toe of Rt foot (04/06/24 Goal status: MET  5.  Report postural corrections at home to reduce lumbar/thoracic stress with standing Baseline: has been using walker to assist (03/30/24) Goal status: In progress    LONG TERM GOALS: Target date: 05/03/2024    Be independent in advanced HEP Baseline:  Goal status: INITIAL  2.  Improve Modified Oswestry to < or = to 13/45 (29% disability) Baseline: 40% disability  Goal status: INITIAL  3.  Report > or = to 50% reduction in LBP/thoracic pain with ADLs and self-care Baseline: 50% reduced frequency, no change in intensity (04/06/24) Goal status: In progress  4.  Perform 5x sit to stand in < or = to 15 seconds with min UE support to reduce falls risk Baseline: 20 seconds  Goal status: INITIAL  5.  Stand and walk for 6-8 minutes without limitation for improved independence in the community Baseline:  Goal status: INITIAL   PLAN:  PT FREQUENCY: 2x/week  PT DURATION: 8 weeks  PLANNED INTERVENTIONS: 97110-Therapeutic exercises, 97530- Therapeutic activity, 97112- Neuromuscular re-education, 97535- Self Care, 16109- Manual therapy, [phone]- Gait training, [phone]- Canalith repositioning, [id number]- Aquatic Therapy, [phone]- Electrical stimulation (unattended), [phone]- Electrical stimulation (manual), Patient/Family education, Stair training, Taping, Dry Needling, Joint mobilization, Spinal manipulation, Spinal mobilization, Vestibular training, Cryotherapy, and Moist heat.  PLAN FOR NEXT SESSION: DN as indicated, work on posture, sit to stand, lumbar and hip flexibility.  Dry needling to thoracic/lumbar to help with spasms   Luella Sager, PT 04/06/24 11:05 AM  [facility] [address] [place], [place] [address] Phone # [phone] Fax [phone]

## 2024-04-07 ENCOUNTER — Ambulatory Visit
Admission: RE | Admit: 2024-04-07 | Discharge: 2024-04-07 | Disposition: A | Discharge status: Home / Self Care | Source: Ambulatory Visit | Attending: Internal Medicine | Admitting: Internal Medicine

## 2024-04-07 DIAGNOSIS — R590 Localized enlarged lymph nodes: Secondary | ICD-10-CM | POA: Diagnosis not present

## 2024-04-07 DIAGNOSIS — N6311 Unspecified lump in the right breast, upper outer quadrant: Secondary | ICD-10-CM

## 2024-04-07 DIAGNOSIS — N6331 Unspecified lump in axillary tail of the right breast: Secondary | ICD-10-CM | POA: Diagnosis not present

## 2024-04-07 DIAGNOSIS — N6313 Unspecified lump in the right breast, lower outer quadrant: Secondary | ICD-10-CM | POA: Diagnosis not present

## 2024-04-08 ENCOUNTER — Ambulatory Visit: Admitting: Physical Therapy

## 2024-04-13 ENCOUNTER — Ambulatory Visit

## 2024-04-13 DIAGNOSIS — R252 Cramp and spasm: Secondary | ICD-10-CM | POA: Diagnosis not present

## 2024-04-13 DIAGNOSIS — M5459 Other low back pain: Secondary | ICD-10-CM

## 2024-04-13 DIAGNOSIS — M6281 Muscle weakness (generalized): Secondary | ICD-10-CM | POA: Diagnosis not present

## 2024-04-13 DIAGNOSIS — R2689 Other abnormalities of gait and mobility: Secondary | ICD-10-CM | POA: Diagnosis not present

## 2024-04-13 DIAGNOSIS — M546 Pain in thoracic spine: Secondary | ICD-10-CM

## 2024-04-13 NOTE — Therapy (Signed)
 OUTPATIENT PHYSICAL THERAPY TREATMENT   Patient Name: Kerry Wright MRN: [id number] DOB:[birth year], 83 y.o., female Today's Date: 04/13/2024  END OF SESSION:  PT End of Session - 04/13/24 1145     Visit Number 9    Date for PT Re-Evaluation 05/03/24    Authorization Type [employer]    Progress Note Due on Visit 10    PT Start Time 1103    PT Stop Time 1148    PT Time Calculation (min) 45 min    Activity Tolerance Patient tolerated treatment well    Behavior During Therapy WFL for tasks assessed/performed                    Past Medical History:  Diagnosis Date   Complication of anesthesia    Headache(784.0)    hx. migraines - less occurring now.   History of kidney stones    remains with kidney stones(not a problem at this time)   Hypertension    Hyperthyroidism    hx. Graves disease "'tx.with radioactive iodine"   Osteoarthritis    PONV (postoperative nausea and vomiting)    Past Surgical History:  Procedure Laterality Date   COLONOSCOPY WITH PROPOFOL  N/A 07/31/2014   Procedure: COLONOSCOPY WITH PROPOFOL ;  Surgeon: Garrett Kallman, MD;  Location: [facility];  Service: Endoscopy;  Laterality: N/A;   TONSILLECTOMY     child   There are no active problems to display for this patient.   REFERRING PROVIDER/PCP: Tressia Fry, MD   REFERRING DIAG: M54.9 (ICD-10-CM) - Back pain, Rt sided thoracic pain with muscle spasms  Rationale for Evaluation and Treatment: Rehabilitation  THERAPY DIAG:  Pain in thoracic spine  Other low back pain  Other abnormalities of gait and mobility  Cramp and spasm  ONSET DATE: 4 month history   SUBJECTIVE:                                                                                                                                                                                           SUBJECTIVE STATEMENT: I have had a lot of pain in the Lt side of my back.  They are worse than ever and intermittent.  Had to  cancel on Friday.    From Evaluation: Pt present to PT with Rt>Lt thoracic pain and spasms that began 10/2023. MD has prescribed muscle relaxers and is not sure if it is helping. She walks with a quad cane with significant trunk flexion.     PERTINENT HISTORY:  HTN, Rt hip OA (needs THA), migraines   PAIN: 04/13/24 Are you having pain? Yes: NPRS scale: 0-8/10 Pain  location: bil thoracic and low back  Pain description: shooting, aching  Aggravating factors: early morning, bending over to reach, sometimes just sitting still , random  Relieving factors: pain patch, Aleve, Excedrin  PRECAUTIONS: Fall  RED FLAGS: None   WEIGHT BEARING RESTRICTIONS: No  FALLS:  Has patient fallen in last 6 months? No  LIVING ENVIRONMENT: Lives with: lives alone Lives in: House/apartment Stairs: Yes: Internal: 12 steps; on right going uplives on main level, doesn't need to go up steps  Has following equipment at home: Chiropractor, Environmental consultant - 4 wheeled, and shower chair  OCCUPATION: retired   PLOF: Independent with community mobility without device and Leisure: watches TV, reading, crossword puzzles, uses hand weights for exercise  PATIENT GOALS: reduce back pain, improve stability   NEXT MD VISIT: May 2025  OBJECTIVE:  Note: Objective measures were completed at Evaluation unless otherwise noted.  DIAGNOSTIC FINDINGS:  None   PATIENT SURVEYS:  03/07/24: Modified Oswestry 18/45 =40% disability   COGNITION: Overall cognitive status: Within functional limits for tasks assessed     SENSATION: WFL   POSTURE: rounded shoulders, forward head, flexed trunk , and weight shift left  PALPATION: Palpable tenderness over Lt>Rt thoracic paraspinals, Lt rhomboids and lumbar paraspinals   LOWER EXTREMITY MMT:    Rt hip rests in ER  4/5, Lt hip 4+/5, bil knees 4+/5   FUNCTIONAL TESTS:  04/06/24: 5x sit to stand: 20.3 seconds with controlled descent  3 min walk test: 255 feet with 7/10  RPE  03/08/24: 5 times sit to stand: 20.02 seconds with uncontrolled descent 03/16/24: 3 min walk test 264 feet with 8/10 RPE  GAIT: Distance walked: 50 Assistive device utilized: Quad cane large base Level of assistance: Modified independence Comments: ER at Rt hip, flexed trunk  TREATMENT DATE:  04/13/24: NuStep: level 3x 10 minutes- PT present to discuss progress Scap squeezes: 5" hold x10 Seated trunk ext, rotation, diagonals x 10 ea holding ball and following with eyes-added weighted ball (blue) today Seated hip to hip with blue weighted ball: x20 Seated lumbar ext with magenta powercord x 20 Horizontal abduction and ER with red band x10  Seated press into purple ball with 5" hold: x10 Seated rows and ER with red band 2x10  Trigger Point Dry Needling  Subsequent Treatment: Instructions provided previously at initial dry needling treatment.  Patient Verbal Consent Given: Yes Education Handout Provided: Previously Provided Muscles Treated:  Rt thoracic and Lt multifidi at thoracolumbar junction  Electrical Stimulation Performed: No Treatment Response/Outcome: Utilized skilled palpation to identify bony landmarks and trigger points.  Able to illicit twitch response and muscle elongation.  Soft tissue mobilization to muscles needled to further promote tissue elongation and decreased pain.     04/06/24: NuStep: level 3x 10 minutes- PT present to discuss progress Seated shoulder flexion and scaption 2# DB 2 x 10 Seated trunk ext, rotation, diagonals x 10 ea holding ball and following with eyes-added weighted ball (yellow)  Seated hip to ear with yellow weighted ball: x20 Seated lumbar ext with magenta powercord x 20 Horizontal abduction and ER with red band 2x10  Seated press into purple ball with 5" hold: x15 Seated rows with red band 2x10  3 min walk    04/01/24: NuStep: level 3x 10 minutes- PT present to discuss progress Seated shoulder flexion 2# DB 2 x 10 Scap squeezes:  5" hold x10 Seated trunk ext, rotation, diagonals x 10 ea holding ball and following with eyes-added weighted ball (  blue)  Seated hip to ear with blue weighted ball: x20 Seated lumbar ext with magenta powercord x 20 Horizontal abduction and ER with red band 2x10  Seated press into purple ball with 5" hold: x15 Seated rows with red band 2x10     PATIENT EDUCATION:  Education details: [email or web address], HEP update Person educated: Patient Education method: Explanation, Demonstration, and Handouts Education comprehension: verbalized understanding and returned demonstration  HOME EXERCISE PROGRAM: Access Code: [email or web address] URL: [email or web address] Date: 03/23/2024 Prepared by: Loetta Ringer  Exercises - Supine Hip Internal and External Rotation  - 1 x daily - 7 x weekly - 3 sets - 10 reps - Seated Scapular Retraction  - 5 x daily - 7 x weekly - 1 sets - 10 reps - 5 hold - Seated Thoracic Flexion and Rotation with Arms Crossed  - 3 x daily - 7 x weekly - 1 sets - 3 reps - 10 hold - Seated Correct Posture  - 1 x daily - 7 x weekly - 3 sets - 10 reps - Correct Standing Posture  - 1 x daily - 7 x weekly - 3 sets - 10 reps - Seated Hamstring Stretch  - 3 x daily - 7 x weekly - 1 sets - 3 reps - 20 hold - Supine Lower Trunk Rotation  - 3 x daily - 7 x weekly - 1 sets - 3 reps - 20 hold - Seated Thoracic Extension Arms Overhead  - 1 x daily - 7 x weekly - 1-2 sets - 10 reps - 2-3 sec hold - Seated Trunk Rotation  - 1 x daily - 7 x weekly - 1-2 sets - 10 reps - 2-3 sec hold - Seated Diagonal Chops with Medicine Ball  - 1 x daily - 7 x weekly - 1-2 sets - 10 reps - 2-3 hold - Seated Bilateral Shoulder External Rotation with Resistance  - 1 x daily - 7 x weekly - 2 sets - 10 reps - Seated Shoulder Horizontal Abduction with Resistance  - 1 x daily - 7 x weekly - 2 sets - 10 reps ASSESSMENT:  CLINICAL IMPRESSION: Pt had to miss dry needling last session due to conflicting appt.  Pt reports that  muscle relaxers and a chair with good lumbar support help to reduce spasms.  She arrived with increased Lt sided thoracic/lumbar spasms.  She is working on her posture at home and is challenged due to chronicity of her forward flexed posture. Good response to dry needling with improved tissue mobility and twitch response today. Overall, the pain reduction is not lasting with dry needling so might discontinue at some point. She is also able to maintain upright posture in sitting with fewer verbal cues throughout session and improved control with sit to stand.   Patient will benefit from skilled PT to address the below impairments and improve overall function.     OBJECTIVE IMPAIRMENTS: Abnormal gait, decreased activity tolerance, decreased balance, decreased mobility, difficulty walking, decreased strength, decreased safety awareness, hypomobility, increased muscle spasms, impaired flexibility, improper body mechanics, postural dysfunction, and pain.   ACTIVITY LIMITATIONS: carrying, lifting, standing, squatting, stairs, transfers, and locomotion level  PARTICIPATION LIMITATIONS: meal prep, cleaning, laundry, driving, community activity, and church  PERSONAL FACTORS: Age, Time since onset of injury/illness/exacerbation, and 1-2 comorbidities: Lt hip OA, chronic gait abnormality are also affecting patient's functional outcome.   REHAB POTENTIAL: Good  CLINICAL DECISION MAKING: Evolving/moderate complexity  EVALUATION COMPLEXITY: Moderate   GOALS: Goals reviewed with patient? Yes  SHORT  TERM GOALS: Target date: 04/05/2024      Be independent in initial HEP Baseline: Goal status: MET  2.  Perform 3 min walk test to establish baseline and new goal set after Baseline: 255 feet with RPE 7/10 (04/06/24) Goal status: MET  3.  Report > or = to 25% reduction in LBP/thoracic pain with ADLs and self-care Baseline: 50% reduction in frequency (04/06/24) Goal status: In progress  4.  Improve LE  strength to perform stand to sit with controlled descent x 5 reps  Baseline: able to do this with controlled descent, on toe of Rt foot (04/06/24 Goal status: MET  5.  Report postural corrections at home to reduce lumbar/thoracic stress with standing Baseline: has been using walker to assist (03/30/24) Goal status: In progress    LONG TERM GOALS: Target date: 05/03/2024    Be independent in advanced HEP Baseline:  Goal status: INITIAL  2.  Improve Modified Oswestry to < or = to 13/45 (29% disability) Baseline: 40% disability  Goal status: INITIAL  3.  Report > or = to 50% reduction in LBP/thoracic pain with ADLs and self-care Baseline: 50% reduced frequency, no change in intensity (04/06/24) Goal status: In progress  4.  Perform 5x sit to stand in < or = to 15 seconds with min UE support to reduce falls risk Baseline: 20 seconds  Goal status: INITIAL  5.  Stand and walk for 6-8 minutes without limitation for improved independence in the community Baseline:  Goal status: INITIAL   PLAN:  PT FREQUENCY: 2x/week  PT DURATION: 8 weeks  PLANNED INTERVENTIONS: 97110-Therapeutic exercises, 97530- Therapeutic activity, 97112- Neuromuscular re-education, 97535- Self Care, 16109- Manual therapy, [phone]- Gait training, [phone]- Canalith repositioning, [id number]- Aquatic Therapy, [phone]- Electrical stimulation (unattended), [phone]- Electrical stimulation (manual), Patient/Family education, Stair training, Taping, Dry Needling, Joint mobilization, Spinal manipulation, Spinal mobilization, Vestibular training, Cryotherapy, and Moist heat.  PLAN FOR NEXT SESSION: DN as indicated, work on posture, sit to stand, lumbar and hip flexibility.  Dry needling to thoracic/lumbar to help with spasms if longer relief. 10th visit next- 5x sit to stand, Modified Oswestry and walk test    Luella Sager, PT 04/13/24 11:50 AM  [facility] [address] [place], [place]  [address] Phone # [phone] Fax [phone]

## 2024-04-15 ENCOUNTER — Ambulatory Visit: Admitting: Physical Therapy

## 2024-04-15 DIAGNOSIS — N200 Calculus of kidney: Secondary | ICD-10-CM | POA: Diagnosis not present

## 2024-04-20 ENCOUNTER — Ambulatory Visit: Attending: Internal Medicine

## 2024-04-20 DIAGNOSIS — M5459 Other low back pain: Secondary | ICD-10-CM | POA: Diagnosis not present

## 2024-04-20 DIAGNOSIS — M546 Pain in thoracic spine: Secondary | ICD-10-CM | POA: Insufficient documentation

## 2024-04-20 DIAGNOSIS — R252 Cramp and spasm: Secondary | ICD-10-CM | POA: Insufficient documentation

## 2024-04-20 DIAGNOSIS — M6281 Muscle weakness (generalized): Secondary | ICD-10-CM | POA: Insufficient documentation

## 2024-04-20 DIAGNOSIS — R2689 Other abnormalities of gait and mobility: Secondary | ICD-10-CM | POA: Diagnosis not present

## 2024-04-20 NOTE — Therapy (Signed)
 OUTPATIENT PHYSICAL THERAPY TREATMENT   Patient Name: Kerry Wright MRN: [id number] DOB:[birth year], 83 y.o., female Today's Date: 04/20/2024 Progress Note Reporting Period 03/08/24 to 04/20/24  See note below for Objective Data and Assessment of Progress/Goals.     END OF SESSION:  PT End of Session - 04/20/24 1101     Visit Number 10    Date for PT Re-Evaluation 05/03/24    Authorization Type [employer]    Progress Note Due on Visit 10    PT Start Time 1013    PT Stop Time 1059    PT Time Calculation (min) 46 min    Activity Tolerance Patient tolerated treatment well    Behavior During Therapy WFL for tasks assessed/performed                     Past Medical History:  Diagnosis Date   Complication of anesthesia    Headache(784.0)    hx. migraines - less occurring now.   History of kidney stones    remains with kidney stones(not a problem at this time)   Hypertension    Hyperthyroidism    hx. Graves disease "'tx.with radioactive iodine"   Osteoarthritis    PONV (postoperative nausea and vomiting)    Past Surgical History:  Procedure Laterality Date   COLONOSCOPY WITH PROPOFOL  N/A 07/31/2014   Procedure: COLONOSCOPY WITH PROPOFOL ;  Surgeon: Garrett Kallman, MD;  Location: [facility];  Service: Endoscopy;  Laterality: N/A;   TONSILLECTOMY     child   There are no active problems to display for this patient.   REFERRING PROVIDER/PCP: Tressia Fry, MD   REFERRING DIAG: M54.9 (ICD-10-CM) - Back pain, Rt sided thoracic pain with muscle spasms  Rationale for Evaluation and Treatment: Rehabilitation  THERAPY DIAG:  Pain in thoracic spine  Other low back pain  Other abnormalities of gait and mobility  Cramp and spasm  Muscle weakness (generalized)  ONSET DATE: 4 month history   SUBJECTIVE:                                                                                                                                                                                            SUBJECTIVE STATEMENT: I have been having spasms in my back this morning.  After dry needling last session, I had a few days of relief.    From Evaluation: Pt present to PT with Rt>Lt thoracic pain and spasms that began 10/2023. MD has prescribed muscle relaxers and is not sure if it is helping. She walks with a quad cane with significant trunk flexion.     PERTINENT HISTORY:  HTN, Rt hip OA (needs THA), migraines   PAIN: 04/13/24 Are you having pain? Yes: NPRS scale: 0-8/10 Pain location: bil thoracic and low back  Pain description: shooting, aching  Aggravating factors: early morning, bending over to reach, sometimes just sitting still , random  Relieving factors: pain patch, Aleve, Excedrin  PRECAUTIONS: Fall  RED FLAGS: None   WEIGHT BEARING RESTRICTIONS: No  FALLS:  Has patient fallen in last 6 months? No  LIVING ENVIRONMENT: Lives with: lives alone Lives in: House/apartment Stairs: Yes: Internal: 12 steps; on right going uplives on main level, doesn't need to go up steps  Has following equipment at home: Chiropractor, Environmental consultant - 4 wheeled, and shower chair  OCCUPATION: retired   PLOF: Independent with community mobility without device and Leisure: watches TV, reading, crossword puzzles, uses hand weights for exercise  PATIENT GOALS: reduce back pain, improve stability   NEXT MD VISIT: May 2025  OBJECTIVE:  Note: Objective measures were completed at Evaluation unless otherwise noted.  DIAGNOSTIC FINDINGS:  None   PATIENT SURVEYS:  03/07/24: Modified Oswestry 18/45 =40% disability   COGNITION: Overall cognitive status: Within functional limits for tasks assessed     SENSATION: WFL   POSTURE: rounded shoulders, forward head, flexed trunk , and weight shift left  PALPATION: Palpable tenderness over Lt>Rt thoracic paraspinals, Lt rhomboids and lumbar paraspinals   LOWER EXTREMITY MMT:    Rt hip rests in ER  4/5, Lt hip  4+/5, bil knees 4+/5   FUNCTIONAL TESTS:  04/06/24: 5x sit to stand: 20.3 seconds with controlled descent  3 min walk test: 255 feet with 7/10 RPE  03/08/24: 5 times sit to stand: 20.02 seconds with uncontrolled descent 03/16/24: 3 min walk test 264 feet with 8/10 RPE  GAIT: Distance walked: 50 Assistive device utilized: Quad cane large base Level of assistance: Modified independence Comments: ER at Rt hip, flexed trunk  TREATMENT DATE:   04/20/24: NuStep: level 5x 10 minutes- PT present to discuss progress Scap squeezes: 5" hold x10 Seated press into ball: opp arm/leg 5" hold 2x10 each Seated ball squeeze: 5" hold 2x10 Seated trunk ext, rotation, diagonals x 10 ea holding ball and following with eyes-added weighted ball (blue) today Seated hip to hip with blue weighted ball: x20 Seated lumbar ext with magenta powercord x 20 Seated rows with green band: 2x10 Chin tucks 5" x10 Long arc quads: 2x10 Steam engines 2# weights 2x10 Stand at walker for posture: unable to place Rt foot on floorx1 min- neck tension reported     04/13/24: NuStep: level 3x 10 minutes- PT present to discuss progress Scap squeezes: 5" hold x10 Seated trunk ext, rotation, diagonals x 10 ea holding ball and following with eyes-added weighted ball (blue) today Seated hip to hip with blue weighted ball: x20 Seated lumbar ext with magenta powercord x 20 Horizontal abduction and ER with red band x10  Seated press into purple ball with 5" hold: x10 Seated rows and ER with red band 2x10  Trigger Point Dry Needling  Subsequent Treatment: Instructions provided previously at initial dry needling treatment.  Patient Verbal Consent Given: Yes Education Handout Provided: Previously Provided Muscles Treated:  Rt thoracic and Lt multifidi at thoracolumbar junction  Electrical Stimulation Performed: No Treatment Response/Outcome: Utilized skilled palpation to identify bony landmarks and trigger points.  Able to illicit  twitch response and muscle elongation.  Soft tissue mobilization to muscles needled to further promote tissue elongation and decreased pain.  04/06/24: NuStep: level 3x 10 minutes- PT present to discuss progress Seated shoulder flexion and scaption 2# DB 2 x 10 Seated trunk ext, rotation, diagonals x 10 ea holding ball and following with eyes-added weighted ball (yellow)  Seated hip to ear with yellow weighted ball: x20 Seated lumbar ext with magenta powercord x 20 Horizontal abduction and ER with red band 2x10  Seated press into purple ball with 5" hold: x15 Seated rows with red band 2x10  3 min walk  PATIENT EDUCATION:  Education details: [email or web address], HEP update Person educated: Patient Education method: Explanation, Demonstration, and Handouts Education comprehension: verbalized understanding and returned demonstration  HOME EXERCISE PROGRAM: Access Code: [email or web address] URL: [email or web address] Date: 03/23/2024 Prepared by: Loetta Ringer  Exercises - Supine Hip Internal and External Rotation  - 1 x daily - 7 x weekly - 3 sets - 10 reps - Seated Scapular Retraction  - 5 x daily - 7 x weekly - 1 sets - 10 reps - 5 hold - Seated Thoracic Flexion and Rotation with Arms Crossed  - 3 x daily - 7 x weekly - 1 sets - 3 reps - 10 hold - Seated Correct Posture  - 1 x daily - 7 x weekly - 3 sets - 10 reps - Correct Standing Posture  - 1 x daily - 7 x weekly - 3 sets - 10 reps - Seated Hamstring Stretch  - 3 x daily - 7 x weekly - 1 sets - 3 reps - 20 hold - Supine Lower Trunk Rotation  - 3 x daily - 7 x weekly - 1 sets - 3 reps - 20 hold - Seated Thoracic Extension Arms Overhead  - 1 x daily - 7 x weekly - 1-2 sets - 10 reps - 2-3 sec hold - Seated Trunk Rotation  - 1 x daily - 7 x weekly - 1-2 sets - 10 reps - 2-3 sec hold - Seated Diagonal Chops with Medicine Ball  - 1 x daily - 7 x weekly - 1-2 sets - 10 reps - 2-3 hold - Seated Bilateral Shoulder External Rotation with Resistance  -  1 x daily - 7 x weekly - 2 sets - 10 reps - Seated Shoulder Horizontal Abduction with Resistance  - 1 x daily - 7 x weekly - 2 sets - 10 reps ASSESSMENT:  CLINICAL IMPRESSION: Pt had a few days of relief after last session, with minimal to no spasms.  Pt will see MD next week to discuss continued symptoms.  She is working on her posture at home and is challenged due to chronicity of her forward flexed posture and Rt hip contracture.  Improved quality of movement overall and requires verbal cues for alignment and control of movement intermittently. Able to advance exercise today.  Pt is more aware of her posture and core activation with exercises. Slow progress due to chronicity of condition.  Patient will benefit from skilled PT to address the below impairments and improve overall function.     OBJECTIVE IMPAIRMENTS: Abnormal gait, decreased activity tolerance, decreased balance, decreased mobility, difficulty walking, decreased strength, decreased safety awareness, hypomobility, increased muscle spasms, impaired flexibility, improper body mechanics, postural dysfunction, and pain.   ACTIVITY LIMITATIONS: carrying, lifting, standing, squatting, stairs, transfers, and locomotion level  PARTICIPATION LIMITATIONS: meal prep, cleaning, laundry, driving, community activity, and church  PERSONAL FACTORS: Age, Time since onset of injury/illness/exacerbation, and 1-2 comorbidities: Lt hip OA, chronic gait abnormality are also affecting patient's functional outcome.   REHAB POTENTIAL: Good  CLINICAL DECISION MAKING: Evolving/moderate complexity  EVALUATION COMPLEXITY: Moderate   GOALS: Goals reviewed with patient? Yes  SHORT TERM GOALS: Target date: 04/05/2024      Be independent in initial HEP Baseline: Goal status: MET  2.  Perform 3 min walk test to establish baseline and new goal set after Baseline: 255 feet with RPE 7/10 (04/06/24) Goal status: MET  3.  Report > or = to 25% reduction  in LBP/thoracic pain with ADLs and self-care Baseline: 50% reduction in frequency (04/06/24) Goal status: In progress  4.  Improve LE strength to perform stand to sit with controlled descent x 5 reps  Baseline: able to do this with controlled descent, on toe of Rt foot (04/06/24 Goal status: MET  5.  Report postural corrections at home to reduce lumbar/thoracic stress with standing Baseline: has been using walker to assist (03/30/24) Goal status: In progress    LONG TERM GOALS: Target date: 05/03/2024    Be independent in advanced HEP Baseline:  Goal status: INITIAL  2.  Improve Modified Oswestry to < or = to 13/45 (29% disability) Baseline: 40% disability  Goal status: INITIAL  3.  Report > or = to 50% reduction in LBP/thoracic pain with ADLs and self-care Baseline: 50% reduced frequency, no change in intensity (04/06/24) Goal status: In progress  4.  Perform 5x sit to stand in < or = to 15 seconds with min UE support to reduce falls risk Baseline: 20 seconds  Goal status: INITIAL  5.  Stand and walk for 6-8 minutes without limitation for improved independence in the community Baseline:  Goal status: INITIAL   PLAN:  PT FREQUENCY: 2x/week  PT DURATION: 8 weeks  PLANNED INTERVENTIONS: 97110-Therapeutic exercises, 97530- Therapeutic activity, 97112- Neuromuscular re-education, 97535- Self Care, 13244- Manual therapy, [phone]- Gait training, [phone]- Canalith repositioning, [id number]- Aquatic Therapy, [phone]- Electrical stimulation (unattended), [phone]- Electrical stimulation (manual), Patient/Family education, Stair training, Taping, Dry Needling, Joint mobilization, Spinal manipulation, Spinal mobilization, Vestibular training, Cryotherapy, and Moist heat.  PLAN FOR NEXT SESSION: DN as indicated, work on posture, sit to stand, lumbar and hip flexibility.  Dry needling to thoracic/lumbar to help with spasms if longer relief. 10th visit next- 5x sit to stand, Modified Oswestry and walk  test    Luella Sager, PT 04/20/24 11:03 AM  [facility] [address], [address] [place], [place] [address] Phone # [phone] Fax [phone]

## 2024-04-21 NOTE — Therapy (Signed)
 OUTPATIENT PHYSICAL THERAPY TREATMENT   Patient Name: Kerry Wright MRN: [id number] DOB:[birth year], 83 y.o., female Today's Date: 04/22/2024    END OF SESSION:  PT End of Session - 04/22/24 1040     Visit Number 11    Date for PT Re-Evaluation 05/03/24    Authorization Type [employer]    Progress Note Due on Visit 10    PT Start Time 1002    PT Stop Time 1045    PT Time Calculation (min) 43 min    Activity Tolerance Patient tolerated treatment well    Behavior During Therapy WFL for tasks assessed/performed                      Past Medical History:  Diagnosis Date   Complication of anesthesia    Headache(784.0)    hx. migraines - less occurring now.   History of kidney stones    remains with kidney stones(not a problem at this time)   Hypertension    Hyperthyroidism    hx. Graves disease "'tx.with radioactive iodine"   Osteoarthritis    PONV (postoperative nausea and vomiting)    Past Surgical History:  Procedure Laterality Date   COLONOSCOPY WITH PROPOFOL  N/A 07/31/2014   Procedure: COLONOSCOPY WITH PROPOFOL ;  Surgeon: Garrett Kallman, MD;  Location: [facility];  Service: Endoscopy;  Laterality: N/A;   TONSILLECTOMY     child   There are no active problems to display for this patient.   REFERRING PROVIDER/PCP: Tressia Fry, MD   REFERRING DIAG: M54.9 (ICD-10-CM) - Back pain, Rt sided thoracic pain with muscle spasms  Rationale for Evaluation and Treatment: Rehabilitation  THERAPY DIAG:  Pain in thoracic spine  Other low back pain  Other abnormalities of gait and mobility  Muscle weakness (generalized)  Cramp and spasm  ONSET DATE: 4 month history   SUBJECTIVE:                                                                                                                                                                                           SUBJECTIVE STATEMENT: I'm feeling a tiny bit queezy today.    From Evaluation: Pt  present to PT with Rt>Lt thoracic pain and spasms that began 10/2023. MD has prescribed muscle relaxers and is not sure if it is helping. She walks with a quad cane with significant trunk flexion.     PERTINENT HISTORY:  HTN, Rt hip OA (needs THA), migraines   PAIN: 04/13/24 Are you having pain? Yes: NPRS scale: 0-8/10 Pain location: bil thoracic and low back  Pain description: shooting, aching  Aggravating  factors: early morning, bending over to reach, sometimes just sitting still , random  Relieving factors: pain patch, Aleve, Excedrin  PRECAUTIONS: Fall  RED FLAGS: None   WEIGHT BEARING RESTRICTIONS: No  FALLS:  Has patient fallen in last 6 months? No  LIVING ENVIRONMENT: Lives with: lives alone Lives in: House/apartment Stairs: Yes: Internal: 12 steps; on right going uplives on main level, doesn't need to go up steps  Has following equipment at home: Chiropractor, Environmental consultant - 4 wheeled, and shower chair  OCCUPATION: retired   PLOF: Independent with community mobility without device and Leisure: watches TV, reading, crossword puzzles, uses hand weights for exercise  PATIENT GOALS: reduce back pain, improve stability   NEXT MD VISIT: May 2025  OBJECTIVE:  Note: Objective measures were completed at Evaluation unless otherwise noted.  DIAGNOSTIC FINDINGS:  None   PATIENT SURVEYS:  03/07/24: Modified Oswestry 18/45 =40% disability  04/22/24   Modified Oswestry 9 / 50 = 18.0 %  COGNITION: Overall cognitive status: Within functional limits for tasks assessed     SENSATION: WFL   POSTURE: rounded shoulders, forward head, flexed trunk , and weight shift left  PALPATION: Palpable tenderness over Lt>Rt thoracic paraspinals, Lt rhomboids and lumbar paraspinals   LOWER EXTREMITY MMT:    Rt hip rests in ER  4/5, Lt hip 4+/5, bil knees 4+/5   FUNCTIONAL TESTS:  04/06/24: 5x sit to stand: 20.3 seconds with controlled descent  3 min walk test: 255 feet with 7/10  RPE  03/08/24: 5 times sit to stand: 20.02 seconds with uncontrolled descent 03/16/24: 3 min walk test 264 feet with 8/10 RPE  04/22/24 3 min walk test 390 ft with 8-9/10 RPE  5XSTS 19.16 sec with controlled descent   GAIT: Distance walked: 50 Assistive device utilized: Quad cane large base Level of assistance: Modified independence Comments: ER at Rt hip, flexed trunk  TREATMENT DATE:   04/22/24: 3 min walk test 390 ft 5x sit to stand completed  Modified Oswestry completed while on Nustep NuStep: level 5x 10 minutes- PT present to discuss progress Seated press into ball: opp arm/leg 5" hold 2x10 each Seated trunk ext, rotation, diagonals x 10 ea holding ball and following with eyes-added weighted ball (blue) today Seated hip to hip with blue weighted ball: x20 Steam engines 2# weights 2x10 Biceps curl to OH press 2x10     04/20/24: NuStep: level 5x 10 minutes- PT present to discuss progress Scap squeezes: 5" hold x10 Seated press into ball: opp arm/leg 5" hold 2x10 each Seated ball squeeze: 5" hold 2x10 Seated trunk ext, rotation, diagonals x 10 ea holding ball and following with eyes-added weighted ball (blue) today Seated hip to hip with blue weighted ball: x20 Seated lumbar ext with magenta powercord x 20 Seated rows with green band: 2x10 Chin tucks 5" x10 Long arc quads: 2x10 Steam engines 2# weights 2x10 Stand at walker for posture: unable to place Rt foot on floorx1 min- neck tension reported     04/13/24: NuStep: level 3x 10 minutes- PT present to discuss progress Scap squeezes: 5" hold x10 Seated trunk ext, rotation, diagonals x 10 ea holding ball and following with eyes-added weighted ball (blue) today Seated hip to hip with blue weighted ball: x20 Seated lumbar ext with magenta powercord x 20 Horizontal abduction and ER with red band x10  Seated press into purple ball with 5" hold: x10 Seated rows and ER with red band 2x10  Trigger Point Dry  Needling  Subsequent Treatment: Instructions provided previously at initial dry needling treatment.  Patient Verbal Consent Given: Yes Education Handout Provided: Previously Provided Muscles Treated:  Rt thoracic and Lt multifidi at thoracolumbar junction  Electrical Stimulation Performed: No Treatment Response/Outcome: Utilized skilled palpation to identify bony landmarks and trigger points.  Able to illicit twitch response and muscle elongation.  Soft tissue mobilization to muscles needled to further promote tissue elongation and decreased pain.     04/06/24: NuStep: level 3x 10 minutes- PT present to discuss progress Seated shoulder flexion and scaption 2# DB 2 x 10 Seated trunk ext, rotation, diagonals x 10 ea holding ball and following with eyes-added weighted ball (yellow)  Seated hip to Wright with yellow weighted ball: x20 Seated lumbar ext with magenta powercord x 20 Horizontal abduction and ER with red band 2x10  Seated press into purple ball with 5" hold: x15 Seated rows with red band 2x10  3 min walk  PATIENT EDUCATION:  Education details: [email or web address], HEP update Person educated: Patient Education method: Explanation, Demonstration, and Handouts Education comprehension: verbalized understanding and returned demonstration  HOME EXERCISE PROGRAM: Access Code: [email or web address] URL: [email or web address] Date: 03/23/2024 Prepared by: Loetta Ringer  Exercises - Supine Hip Internal and External Rotation  - 1 x daily - 7 x weekly - 3 sets - 10 reps - Seated Scapular Retraction  - 5 x daily - 7 x weekly - 1 sets - 10 reps - 5 hold - Seated Thoracic Flexion and Rotation with Arms Crossed  - 3 x daily - 7 x weekly - 1 sets - 3 reps - 10 hold - Seated Correct Posture  - 1 x daily - 7 x weekly - 3 sets - 10 reps - Correct Standing Posture  - 1 x daily - 7 x weekly - 3 sets - 10 reps - Seated Hamstring Stretch  - 3 x daily - 7 x weekly - 1 sets - 3 reps - 20 hold - Supine Lower Trunk  Rotation  - 3 x daily - 7 x weekly - 1 sets - 3 reps - 20 hold - Seated Thoracic Extension Arms Overhead  - 1 x daily - 7 x weekly - 1-2 sets - 10 reps - 2-3 sec hold - Seated Trunk Rotation  - 1 x daily - 7 x weekly - 1-2 sets - 10 reps - 2-3 sec hold - Seated Diagonal Chops with Medicine Ball  - 1 x daily - 7 x weekly - 1-2 sets - 10 reps - 2-3 hold - Seated Bilateral Shoulder External Rotation with Resistance  - 1 x daily - 7 x weekly - 2 sets - 10 reps - Seated Shoulder Horizontal Abduction with Resistance  - 1 x daily - 7 x weekly - 2 sets - 10 reps ASSESSMENT:  CLINICAL IMPRESSION: Kerry Wright is making very good progress. She has met her Modified Oswestry goal showing functional improvement and her 3 minute walk test improved to 390 ft today from 264 ft. She has improved slightly with 5XSTS speed, but she was able to perform with controlled descent today.      OBJECTIVE IMPAIRMENTS: Abnormal gait, decreased activity tolerance, decreased balance, decreased mobility, difficulty walking, decreased strength, decreased safety awareness, hypomobility, increased muscle spasms, impaired flexibility, improper body mechanics, postural dysfunction, and pain.   ACTIVITY LIMITATIONS: carrying, lifting, standing, squatting, stairs, transfers, and locomotion level  PARTICIPATION LIMITATIONS: meal prep, cleaning, laundry, driving, community activity, and church  PERSONAL FACTORS: Age, Time since onset of injury/illness/exacerbation, and 1-2 comorbidities: Lt  hip OA, chronic gait abnormality are also affecting patient's functional outcome.   REHAB POTENTIAL: Good  CLINICAL DECISION MAKING: Evolving/moderate complexity  EVALUATION COMPLEXITY: Moderate   GOALS: Goals reviewed with patient? Yes  SHORT TERM GOALS: Target date: 04/05/2024      Be independent in initial HEP Baseline: Goal status: MET  2.  Perform 3 min walk test to establish baseline and new goal set after Baseline: 255 feet with  RPE 7/10 (04/06/24) Goal status: MET  3.  Report > or = to 25% reduction in LBP/thoracic pain with ADLs and self-care Baseline: 50% reduction in frequency (04/06/24) Goal status: In progress  4.  Improve LE strength to perform stand to sit with controlled descent x 5 reps  Baseline: able to do this with controlled descent, on toe of Rt foot (04/06/24 Goal status: MET  5.  Report postural corrections at home to reduce lumbar/thoracic stress with standing Baseline: has been using walker to assist (03/30/24) Goal status: In progress    LONG TERM GOALS: Target date: 05/03/2024    Be independent in advanced HEP Baseline:  Goal status: INITIAL  2.  Improve Modified Oswestry to < or = to 13/45 (29% disability) Baseline: 40% disability  Goal status: MET 04/23/23  3.  Report > or = to 50% reduction in LBP/thoracic pain with ADLs and self-care Baseline: 50% reduced frequency, no change in intensity (04/06/24) Goal status: In progress  4.  Perform 5x sit to stand in < or = to 15 seconds with min UE support to reduce falls risk Baseline: 20 seconds  Goal status: IN PROGRESS 04/22/24  5.  Stand and walk for 6-8 minutes without limitation for improved independence in the community Baseline:  Goal status: INITIAL   PLAN:  PT FREQUENCY: 2x/week  PT DURATION: 8 weeks  PLANNED INTERVENTIONS: 97110-Therapeutic exercises, 97530- Therapeutic activity, 97112- Neuromuscular re-education, 97535- Self Care, 16109- Manual therapy, [phone]- Gait training, [phone]- Canalith repositioning, [id number]- Aquatic Therapy, [phone]- Electrical stimulation (unattended), [phone]- Electrical stimulation (manual), Patient/Family education, Stair training, Taping, Dry Needling, Joint mobilization, Spinal manipulation, Spinal mobilization, Vestibular training, Cryotherapy, and Moist heat.  PLAN FOR NEXT SESSION: DN as indicated, work on posture, sit to stand, lumbar and hip flexibility.  Dry needling to thoracic/lumbar to help with  spasms if longer relief. 10th visit next- 5x sit to stand, Modified Oswestry and walk test    Jinx Mourning, PT  04/22/24 10:53 AM  [facility] [address], [address] [place], [place] [address] Phone # [phone] Fax [phone]

## 2024-04-22 ENCOUNTER — Encounter: Payer: Self-pay | Admitting: Physical Therapy

## 2024-04-22 ENCOUNTER — Ambulatory Visit: Admitting: Physical Therapy

## 2024-04-22 DIAGNOSIS — R252 Cramp and spasm: Secondary | ICD-10-CM | POA: Diagnosis not present

## 2024-04-22 DIAGNOSIS — R2689 Other abnormalities of gait and mobility: Secondary | ICD-10-CM

## 2024-04-22 DIAGNOSIS — M5459 Other low back pain: Secondary | ICD-10-CM

## 2024-04-22 DIAGNOSIS — M546 Pain in thoracic spine: Secondary | ICD-10-CM

## 2024-04-22 DIAGNOSIS — M6281 Muscle weakness (generalized): Secondary | ICD-10-CM

## 2024-04-27 ENCOUNTER — Ambulatory Visit

## 2024-04-27 DIAGNOSIS — M5459 Other low back pain: Secondary | ICD-10-CM | POA: Diagnosis not present

## 2024-04-27 DIAGNOSIS — M6281 Muscle weakness (generalized): Secondary | ICD-10-CM | POA: Diagnosis not present

## 2024-04-27 DIAGNOSIS — M546 Pain in thoracic spine: Secondary | ICD-10-CM | POA: Diagnosis not present

## 2024-04-27 DIAGNOSIS — D649 Anemia, unspecified: Secondary | ICD-10-CM | POA: Diagnosis not present

## 2024-04-27 DIAGNOSIS — R2689 Other abnormalities of gait and mobility: Secondary | ICD-10-CM | POA: Diagnosis not present

## 2024-04-27 DIAGNOSIS — R252 Cramp and spasm: Secondary | ICD-10-CM | POA: Diagnosis not present

## 2024-04-27 NOTE — Therapy (Signed)
 OUTPATIENT PHYSICAL THERAPY TREATMENT   Patient Name: Kerry Wright MRN: [id number] DOB:10-Mar-1941, 83 y.o., female Today's Date: 04/27/2024    END OF SESSION:  PT End of Session - 04/27/24 1104     Visit Number 12    Date for PT Re-Evaluation 05/03/24    Authorization Type [employer]    Progress Note Due on Visit 20    PT Start Time 1014    PT Stop Time 1102    PT Time Calculation (min) 48 min    Activity Tolerance Patient tolerated treatment well    Behavior During Therapy WFL for tasks assessed/performed                       Past Medical History:  Diagnosis Date   Complication of anesthesia    Headache(784.0)    hx. migraines - less occurring now.   History of kidney stones    remains with kidney stones(not a problem at this time)   Hypertension    Hyperthyroidism    hx. Graves disease 'tx.with radioactive iodine   Osteoarthritis    PONV (postoperative nausea and vomiting)    Past Surgical History:  Procedure Laterality Date   COLONOSCOPY WITH PROPOFOL  N/A 07/31/2014   Procedure: COLONOSCOPY WITH PROPOFOL ;  Surgeon: Garrett Kallman, MD;  Location: [facility];  Service: Endoscopy;  Laterality: N/A;   TONSILLECTOMY     child   There are no active problems to display for this patient.   REFERRING PROVIDER/PCP: Tressia Fry, MD   REFERRING DIAG: M54.9 (ICD-10-CM) - Back pain, Rt sided thoracic pain with muscle spasms  Rationale for Evaluation and Treatment: Rehabilitation  THERAPY DIAG:  Pain in thoracic spine  Other low back pain  Other abnormalities of gait and mobility  Muscle weakness (generalized)  Cramp and spasm  ONSET DATE: 4 month history   SUBJECTIVE:                                                                                                                                                                                           SUBJECTIVE STATEMENT: I will see the doctor today.  I am still having a lot of back  pain and spasms.   From Evaluation: Pt present to PT with Rt>Lt thoracic pain and spasms that began 10/2023. MD has prescribed muscle relaxers and is not sure if it is helping. She walks with a quad cane with significant trunk flexion.     PERTINENT HISTORY:  HTN, Rt hip OA (needs THA), migraines   PAIN: 04/27/24 Are you having pain? Yes: NPRS scale: 7/10 Pain location: bil  thoracic and low back  Pain description: shooting, aching  Aggravating factors: early morning, bending over to reach, sometimes just sitting still , random  Relieving factors: pain patch, Aleve, Excedrin  PRECAUTIONS: Fall  RED FLAGS: None   WEIGHT BEARING RESTRICTIONS: No  FALLS:  Has patient fallen in last 6 months? No  LIVING ENVIRONMENT: Lives with: lives alone Lives in: House/apartment Stairs: Yes: Internal: 12 steps; on right going uplives on main level, doesn't need to go up steps  Has following equipment at home: Chiropractor, Environmental consultant - 4 wheeled, and shower chair  OCCUPATION: retired   PLOF: Independent with community mobility without device and Leisure: watches TV, reading, crossword puzzles, uses hand weights for exercise  PATIENT GOALS: reduce back pain, improve stability   NEXT MD VISIT: May 2025  OBJECTIVE:  Note: Objective measures were completed at Evaluation unless otherwise noted.  DIAGNOSTIC FINDINGS:  None   PATIENT SURVEYS:  03/07/24: Modified Oswestry 18/45 =40% disability  04/22/24   Modified Oswestry 9 / 50 = 18.0 %  COGNITION: Overall cognitive status: Within functional limits for tasks assessed     SENSATION: WFL   POSTURE: rounded shoulders, forward head, flexed trunk , and weight shift left  PALPATION: Palpable tenderness over Lt>Rt thoracic paraspinals, Lt rhomboids and lumbar paraspinals   LOWER EXTREMITY MMT:    Rt hip rests in ER  4/5, Lt hip 4+/5, bil knees 4+/5   FUNCTIONAL TESTS:  04/06/24: 5x sit to stand: 20.3 seconds with controlled  descent  3 min walk test: 255 feet with 7/10 RPE  03/08/24: 5 times sit to stand: 20.02 seconds with uncontrolled descent 03/16/24: 3 min walk test 264 feet with 8/10 RPE  04/22/24 3 min walk test 390 ft with 8-9/10 RPE  5XSTS 19.16 sec with controlled descent   GAIT: Distance walked: 50 Assistive device utilized: Quad cane large base Level of assistance: Modified independence Comments: ER at Rt hip, flexed trunk  TREATMENT DATE:   04/27/24: NuStep: level 5x 10 minutes- PT present to discuss progress Seated press into ball: opp arm/leg 5 hold 2x10 each Seated trunk ext, rotation, diagonals x 10 ea holding ball and following with eyes-added weighted ball (blue) today Seated hip to hip with blue weighted ball: x20 Steam engines 2# weights 2x10 Biceps curl to [facility] press 2# x40 reps  Seated lumbar ext with magenta powercord x 20 Long arc quads, marching and ER with 1.5# added to ankles 2x10 Seated row: blue band 2x10    04/22/24: 3 min walk test 390 ft 5x sit to stand completed  Modified Oswestry completed while on Nustep NuStep: level 5x 10 minutes- PT present to discuss progress Seated press into ball: opp arm/leg 5 hold 2x10 each Seated trunk ext, rotation, diagonals x 10 ea holding ball and following with eyes-added weighted ball (blue) today Seated hip to hip with blue weighted ball: x20 Steam engines 2# weights 2x10 Biceps curl to OH press 2x10     04/20/24: NuStep: level 5x 10 minutes- PT present to discuss progress Scap squeezes: 5 hold x10 Seated press into ball: opp arm/leg 5 hold 2x10 each Seated ball squeeze: 5 hold 2x10 Seated trunk ext, rotation, diagonals x 10 ea holding ball and following with eyes-added weighted ball (blue) today Seated hip to hip with blue weighted ball: x20 Seated lumbar ext with magenta powercord x 20 Seated rows with green band: 2x10 Chin tucks 5 x10 Long arc quads: 2x10 Steam engines 2# weights 2x10 Stand at  walker for posture:  unable to place Rt foot on floorx1 min- neck tension reported     PATIENT EDUCATION:  Education details: [id number], HEP update Person educated: Patient Education method: Explanation, Demonstration, and Handouts Education comprehension: verbalized understanding and returned demonstration  HOME EXERCISE PROGRAM: Access Code: [email or web address] URL: [email or web address] Date: 03/23/2024 Prepared by: Loetta Ringer  Exercises - Supine Hip Internal and External Rotation  - 1 x daily - 7 x weekly - 3 sets - 10 reps - Seated Scapular Retraction  - 5 x daily - 7 x weekly - 1 sets - 10 reps - 5 hold - Seated Thoracic Flexion and Rotation with Arms Crossed  - 3 x daily - 7 x weekly - 1 sets - 3 reps - 10 hold - Seated Correct Posture  - 1 x daily - 7 x weekly - 3 sets - 10 reps - Correct Standing Posture  - 1 x daily - 7 x weekly - 3 sets - 10 reps - Seated Hamstring Stretch  - 3 x daily - 7 x weekly - 1 sets - 3 reps - 20 hold - Supine Lower Trunk Rotation  - 3 x daily - 7 x weekly - 1 sets - 3 reps - 20 hold - Seated Thoracic Extension Arms Overhead  - 1 x daily - 7 x weekly - 1-2 sets - 10 reps - 2-3 sec hold - Seated Trunk Rotation  - 1 x daily - 7 x weekly - 1-2 sets - 10 reps - 2-3 sec hold - Seated Diagonal Chops with Medicine Ball  - 1 x daily - 7 x weekly - 1-2 sets - 10 reps - 2-3 hold - Seated Bilateral Shoulder External Rotation with Resistance  - 1 x daily - 7 x weekly - 2 sets - 10 reps - Seated Shoulder Horizontal Abduction with Resistance  - 1 x daily - 7 x weekly - 2 sets - 10 reps ASSESSMENT:  CLINICAL IMPRESSION: Stana Ear is making very good progress with functional tasks.  She has met her Modified Oswestry goal showing functional improvement and her 3 minute walk test improved to 390 ft today from 264 ft.  Pain and spasms continue in the lumbar spine likely related to posture/alignment and fixed Rt hip.  Pt did well with advancement of exercises today and tolerated without pain.  PT  monitored for pain and safety. Patient will benefit from skilled PT to address the below impairments and improve overall function.     OBJECTIVE IMPAIRMENTS: Abnormal gait, decreased activity tolerance, decreased balance, decreased mobility, difficulty walking, decreased strength, decreased safety awareness, hypomobility, increased muscle spasms, impaired flexibility, improper body mechanics, postural dysfunction, and pain.   ACTIVITY LIMITATIONS: carrying, lifting, standing, squatting, stairs, transfers, and locomotion level  PARTICIPATION LIMITATIONS: meal prep, cleaning, laundry, driving, community activity, and church  PERSONAL FACTORS: Age, Time since onset of injury/illness/exacerbation, and 1-2 comorbidities: Lt hip OA, chronic gait abnormality are also affecting patient's functional outcome.   REHAB POTENTIAL: Good  CLINICAL DECISION MAKING: Evolving/moderate complexity  EVALUATION COMPLEXITY: Moderate   GOALS: Goals reviewed with patient? Yes  SHORT TERM GOALS: Target date: 04/05/2024      Be independent in initial HEP Baseline: Goal status: MET  2.  Perform 3 min walk test to establish baseline and new goal set after Baseline: 255 feet with RPE 7/10 (04/06/24) Goal status: MET  3.  Report > or = to 25% reduction in LBP/thoracic pain with ADLs and self-care Baseline: 50% reduction in frequency (04/06/24) Goal  status: In progress  4.  Improve LE strength to perform stand to sit with controlled descent x 5 reps  Baseline: able to do this with controlled descent, on toe of Rt foot (04/06/24 Goal status: MET  5.  Report postural corrections at home to reduce lumbar/thoracic stress with standing Baseline: has been using walker to assist (03/30/24) Goal status: In progress    LONG TERM GOALS: Target date: 05/03/2024    Be independent in advanced HEP Baseline:  Goal status: INITIAL  2.  Improve Modified Oswestry to < or = to 13/45 (29% disability) Baseline: Goal  status: MET 04/23/23  3.  Report > or = to 50% reduction in LBP/thoracic pain with ADLs and self-care Baseline: 50% reduced frequency, no change in intensity (04/06/24) Goal status: In progress  4.  Perform 5x sit to stand in < or = to 15 seconds with min UE support to reduce falls risk Baseline: 20 seconds  Goal status: IN PROGRESS 04/22/24  5.  Stand and walk for 6-8 minutes without limitation for improved independence in the community Baseline:  Goal status: INITIAL   PLAN:  PT FREQUENCY: 2x/week  PT DURATION: 8 weeks  PLANNED INTERVENTIONS: 97110-Therapeutic exercises, 97530- Therapeutic activity, 97112- Neuromuscular re-education, 97535- Self Care, 32440- Manual therapy, [phone]- Gait training, [phone]- Canalith repositioning, [id number]- Aquatic Therapy, [phone]- Electrical stimulation (unattended), [phone]- Electrical stimulation (manual), Patient/Family education, Stair training, Taping, Dry Needling, Joint mobilization, Spinal manipulation, Spinal mobilization, Vestibular training, Cryotherapy, and Moist heat.  PLAN FOR NEXT SESSION: DN as indicated, work on posture, sit to stand, lumbar and hip flexibility.     Luella Sager, PT 04/27/24 11:05 AM  [facility] [address], [address] [place], [place] [address] Phone # [phone] Fax [phone]

## 2024-04-29 ENCOUNTER — Encounter: Admitting: Physical Therapy

## 2024-05-04 ENCOUNTER — Ambulatory Visit

## 2024-05-04 DIAGNOSIS — R2689 Other abnormalities of gait and mobility: Secondary | ICD-10-CM

## 2024-05-04 DIAGNOSIS — R252 Cramp and spasm: Secondary | ICD-10-CM

## 2024-05-04 DIAGNOSIS — M5459 Other low back pain: Secondary | ICD-10-CM | POA: Diagnosis not present

## 2024-05-04 DIAGNOSIS — M546 Pain in thoracic spine: Secondary | ICD-10-CM

## 2024-05-04 DIAGNOSIS — M6281 Muscle weakness (generalized): Secondary | ICD-10-CM

## 2024-05-04 NOTE — Therapy (Signed)
 OUTPATIENT PHYSICAL THERAPY TREATMENT   Patient Name: Kerry Wright MRN: [id number] DOB:1940/12/22, 83 y.o., female Today's Date: 05/04/2024    END OF SESSION:  PT End of Session - 05/04/24 1058     Visit Number 13    Date for PT Re-Evaluation 06/29/24    Authorization Type [employer] Medicare    Progress Note Due on Visit 20    PT Start Time 1017    PT Stop Time 1100    PT Time Calculation (min) 43 min    Activity Tolerance Patient tolerated treatment well    Behavior During Therapy WFL for tasks assessed/performed                     Past Medical History:  Diagnosis Date   Complication of anesthesia    Headache(784.0)    hx. migraines - less occurring now.   History of kidney stones    remains with kidney stones(not a problem at this time)   Hypertension    Hyperthyroidism    hx. Graves disease 'tx.with radioactive iodine   Osteoarthritis    PONV (postoperative nausea and vomiting)    Past Surgical History:  Procedure Laterality Date   COLONOSCOPY WITH PROPOFOL  N/A 07/31/2014   Procedure: COLONOSCOPY WITH PROPOFOL ;  Surgeon: Garrett Kallman, MD;  Location: [facility];  Service: Endoscopy;  Laterality: N/A;   TONSILLECTOMY     child   There are no active problems to display for this patient.   REFERRING PROVIDER/PCP: Tressia Fry, MD   REFERRING DIAG: M54.9 (ICD-10-CM) - Back pain, Rt sided thoracic pain with muscle spasms  Rationale for Evaluation and Treatment: Rehabilitation  THERAPY DIAG:  Pain in thoracic spine - Plan: PT plan of care cert/re-cert  Other low back pain - Plan: PT plan of care cert/re-cert  Other abnormalities of gait and mobility - Plan: PT plan of care cert/re-cert  Muscle weakness (generalized) - Plan: PT plan of care cert/re-cert  Cramp and spasm - Plan: PT plan of care cert/re-cert  ONSET DATE: 4 month history   SUBJECTIVE:                                                                                                                                                                                            SUBJECTIVE STATEMENT: I am 60% better overall regarding my strength, 50% better regarding LBP  From Evaluation: Pt present to PT with Rt>Lt thoracic pain and spasms that began 10/2023. MD has prescribed muscle relaxers and is not sure if it is helping. She walks with a quad cane with significant trunk flexion.  PERTINENT HISTORY:  HTN, Rt hip OA (needs THA), migraines   PAIN: 05/04/24 Are you having pain? Yes: NPRS scale: 5/10 Pain location: bil thoracic and low back  Pain description: shooting, aching  Aggravating factors: early morning, bending over to reach, sometimes just sitting still , random  Relieving factors: pain patch, Aleve, Excedrin  PRECAUTIONS: Fall  RED FLAGS: None   WEIGHT BEARING RESTRICTIONS: No  FALLS:  Has patient fallen in last 6 months? No  LIVING ENVIRONMENT: Lives with: lives alone Lives in: House/apartment Stairs: Yes: Internal: 12 steps; on right going uplives on main level, doesn't need to go up steps  Has following equipment at home: Chiropractor, Environmental consultant - 4 wheeled, and shower chair  OCCUPATION: retired   PLOF: Independent with community mobility without device and Leisure: watches TV, reading, crossword puzzles, uses hand weights for exercise  PATIENT GOALS: reduce back pain, improve stability   NEXT MD VISIT: May 2025  OBJECTIVE:  Note: Objective measures were completed at Evaluation unless otherwise noted.  DIAGNOSTIC FINDINGS:  None   PATIENT SURVEYS:  03/07/24: Modified Oswestry 18/45 =40% disability  04/22/24   Modified Oswestry 9 / 50 = 18.0 %  COGNITION: Overall cognitive status: Within functional limits for tasks assessed     SENSATION: WFL   POSTURE: rounded shoulders, forward head, flexed trunk , and weight shift left  PALPATION: Palpable tenderness over Lt>Rt thoracic paraspinals, Lt rhomboids and lumbar paraspinals    LOWER EXTREMITY MMT:    Rt hip rests in ER  4/5, Lt hip 4+/5, bil knees 4+/5   FUNCTIONAL TESTS:  04/06/24: 5x sit to stand: 20.3 seconds with controlled descent  3 min walk test: 255 feet with 7/10 RPE  03/08/24: 5 times sit to stand: 20.02 seconds with uncontrolled descent 03/16/24: 3 min walk test 264 feet with 8/10 RPE  04/22/24 3 min walk test 390 ft with 8-9/10 RPE  5XSTS 19.16 sec with controlled descent  05/04/24: 5x sit to stand 20 seconds   GAIT: Distance walked: 50 Assistive device utilized: Quad cane large base Level of assistance: Modified independence Comments: ER at Rt hip, flexed trunk  TREATMENT DATE:   05/04/24: NuStep: level 5x 10 minutes- PT present to discuss progress Sit to stand x5 Seated trunk ext, rotation, diagonals x 10 ea holding ball and following with eyes-5# kettlebell today Seated hip to hip 5# kettlebell: x20 Steam engines 2# weights 2x10 Biceps curl to [facility] press 2# x40 reps  Seated lumbar ext with magenta powercord x 20 Long arc quads, marching and ER with 2# added to ankles 2x10    04/27/24: NuStep: level 5x 10 minutes- PT present to discuss progress Seated press into ball: opp arm/leg 5 hold 2x10 each Seated trunk ext, rotation, diagonals x 10 ea holding ball and following with eyes-added weighted ball (blue) today Seated hip to hip with blue weighted ball: x20 Steam engines 2# weights 2x10 Biceps curl to [facility] press 2# x40 reps  Seated lumbar ext with magenta powercord x 20 Long arc quads, marching and ER with 1.5# added to ankles 2x10 Seated row: blue band 2x10    04/22/24: 3 min walk test 390 ft 5x sit to stand completed  Modified Oswestry completed while on Nustep NuStep: level 5x 10 minutes- PT present to discuss progress Seated press into ball: opp arm/leg 5 hold 2x10 each Seated trunk ext, rotation, diagonals x 10 ea holding ball and following with eyes-added weighted ball (blue) today Seated hip to hip  with blue weighted  ball: x20 Steam engines 2# weights 2x10 Biceps curl to OH press 2x10    PATIENT EDUCATION:  Education details: [email or web address], HEP update Person educated: Patient Education method: Explanation, Demonstration, and Handouts Education comprehension: verbalized understanding and returned demonstration  HOME EXERCISE PROGRAM: Access Code: [email or web address] URL: [email or web address] Date: 03/23/2024 Prepared by: Loetta Ringer  Exercises - Supine Hip Internal and External Rotation  - 1 x daily - 7 x weekly - 3 sets - 10 reps - Seated Scapular Retraction  - 5 x daily - 7 x weekly - 1 sets - 10 reps - 5 hold - Seated Thoracic Flexion and Rotation with Arms Crossed  - 3 x daily - 7 x weekly - 1 sets - 3 reps - 10 hold - Seated Correct Posture  - 1 x daily - 7 x weekly - 3 sets - 10 reps - Correct Standing Posture  - 1 x daily - 7 x weekly - 3 sets - 10 reps - Seated Hamstring Stretch  - 3 x daily - 7 x weekly - 1 sets - 3 reps - 20 hold - Supine Lower Trunk Rotation  - 3 x daily - 7 x weekly - 1 sets - 3 reps - 20 hold - Seated Thoracic Extension Arms Overhead  - 1 x daily - 7 x weekly - 1-2 sets - 10 reps - 2-3 sec hold - Seated Trunk Rotation  - 1 x daily - 7 x weekly - 1-2 sets - 10 reps - 2-3 sec hold - Seated Diagonal Chops with Medicine Ball  - 1 x daily - 7 x weekly - 1-2 sets - 10 reps - 2-3 hold - Seated Bilateral Shoulder External Rotation with Resistance  - 1 x daily - 7 x weekly - 2 sets - 10 reps - Seated Shoulder Horizontal Abduction with Resistance  - 1 x daily - 7 x weekly - 2 sets - 10 reps ASSESSMENT:  CLINICAL IMPRESSION: Stana Ear is making very good progress with functional tasks with progress toward goals (see below progress).  Pt reports that she is able to lift and carry objects with greater ease.  She can prepare a meal with increased ease and endurance. LBP is reduced by 50% and ease of activity is improved by 60%.   Pain and spasms continue in the lumbar spine likely related to  posture/flexed trunk.  Pt did well with advancement of exercises today again and tolerated without pain.  PT monitored for pain and safety. Patient will benefit from skilled PT to address the below impairments and improve overall function.     OBJECTIVE IMPAIRMENTS: Abnormal gait, decreased activity tolerance, decreased balance, decreased mobility, difficulty walking, decreased strength, decreased safety awareness, hypomobility, increased muscle spasms, impaired flexibility, improper body mechanics, postural dysfunction, and pain.   ACTIVITY LIMITATIONS: carrying, lifting, standing, squatting, stairs, transfers, and locomotion level  PARTICIPATION LIMITATIONS: meal prep, cleaning, laundry, driving, community activity, and church  PERSONAL FACTORS: Age, Time since onset of injury/illness/exacerbation, and 1-2 comorbidities: Lt hip OA, chronic gait abnormality are also affecting patient's functional outcome.   REHAB POTENTIAL: Good  CLINICAL DECISION MAKING: Evolving/moderate complexity  EVALUATION COMPLEXITY: Moderate   GOALS: Goals reviewed with patient? Yes  SHORT TERM GOALS: Target date: 05/25/2024        Be independent in initial HEP Baseline: Goal status: MET  2.  Ambulate 400 feet with 3 min walk test Baseline: 390 feet with RPE 8-9/10 (04/27/24) Goal status: NEW  3.  Report > or =  to 60% reduction in LBP/thoracic pain with ADLs and self-care Baseline: 50% reduction in frequency (05/04/24) Goal status: In progress  4.  Improve LE strength to perform stand to sit with controlled descent x 5 reps  Baseline: able to do this with controlled descent, on toe of Rt foot (04/06/24 Goal status: MET  5.  Report postural corrections at home to reduce lumbar/thoracic stress with standing Baseline: has been using walker to assist, still very challenged with this (05/04/24) Goal status: In progress    LONG TERM GOALS: Target date: 06/29/2024      Be independent in advanced  HEP Baseline:  Goal status: in progress   2.  Ambulate 425 feet with 3 min walk test Baseline:310 feet Goal status: INITIAL  3.  Report > or = to 70% reduction in LBP/thoracic pain with ADLs and self-care Baseline: 50% reduced frequency, no change in intensity (05/04/24) Goal status: In progress  4.  Perform 5x sit to stand in < or = to 15 seconds with min UE support to reduce falls risk Baseline: 20 seconds  (05/04/24) Goal status: IN PROGRESS  5.  Improve endurance to perform 3 min walk test with RPE < or = to 5/10 Baseline: 8-9/10 Goal status: INITIAL   PLAN:  PT FREQUENCY: 2x/week  PT DURATION: 8 weeks  PLANNED INTERVENTIONS: 97110-Therapeutic exercises, 97530- Therapeutic activity, 97112- Neuromuscular re-education, 97535- Self Care, 11914- Manual therapy, [phone]- Gait training, [phone]- Canalith repositioning, [id number]- Aquatic Therapy, [phone]- Electrical stimulation (unattended), [phone]- Electrical stimulation (manual), Patient/Family education, Stair training, Taping, Dry Needling, Joint mobilization, Spinal manipulation, Spinal mobilization, Vestibular training, Cryotherapy, and Moist heat.  PLAN FOR NEXT SESSION: DN as indicated, work on posture, sit to stand, lumbar and hip flexibility.     Luella Sager, PT 05/04/24 11:08 AM  [facility] [address] [place], [place] [address] Phone # [phone] Fax [phone]

## 2024-05-05 NOTE — Therapy (Signed)
 OUTPATIENT PHYSICAL THERAPY TREATMENT   Patient Name: Kerry Wright MRN: [id number] DOB:01-Aug-1941, 83 y.o., female Today's Date: 05/06/2024    END OF SESSION:  PT End of Session - 05/06/24 1010     Visit Number 14    Date for PT Re-Evaluation 06/29/24    Authorization Type [employer]    Progress Note Due on Visit 20    PT Start Time 1007    PT Stop Time 1048    PT Time Calculation (min) 41 min    Activity Tolerance Patient tolerated treatment well    Behavior During Therapy WFL for tasks assessed/performed                      Past Medical History:  Diagnosis Date   Complication of anesthesia    Headache(784.0)    hx. migraines - less occurring now.   History of kidney stones    remains with kidney stones(not a problem at this time)   Hypertension    Hyperthyroidism    hx. Graves disease 'tx.with radioactive iodine   Osteoarthritis    PONV (postoperative nausea and vomiting)    Past Surgical History:  Procedure Laterality Date   COLONOSCOPY WITH PROPOFOL  N/A 07/31/2014   Procedure: COLONOSCOPY WITH PROPOFOL ;  Surgeon: Gladis MARLA Louder, MD;  Location: [facility];  Service: Endoscopy;  Laterality: N/A;   TONSILLECTOMY     child   There are no active problems to display for this patient.   REFERRING PROVIDER/PCP: Dwight Ave, MD   REFERRING DIAG: M54.9 (ICD-10-CM) - Back pain, Rt sided thoracic pain with muscle spasms  Rationale for Evaluation and Treatment: Rehabilitation  THERAPY DIAG:  Pain in thoracic spine  Other low back pain  Other abnormalities of gait and mobility  Muscle weakness (generalized)  Cramp and spasm  ONSET DATE: 4 month history   SUBJECTIVE:                                                                                                                                                                                           SUBJECTIVE STATEMENT:  I have a bad headache today. I had 8-9/10 pain in by back this  morning.  From Evaluation: Pt present to PT with Rt>Lt thoracic pain and spasms that began 10/2023. MD has prescribed muscle relaxers and is not sure if it is helping. She walks with a quad cane with significant trunk flexion.     PERTINENT HISTORY:  HTN, Rt hip OA (needs THA), migraines   PAIN: 05/04/24 Are you having pain? Yes: NPRS scale: 0/10 Pain location: bil thoracic and low back  Pain description: shooting, aching  Aggravating factors: early morning, bending over to reach, sometimes just sitting still , random  Relieving factors: pain patch, Aleve, Excedrin  PRECAUTIONS: Fall  RED FLAGS: None   WEIGHT BEARING RESTRICTIONS: No  FALLS:  Has patient fallen in last 6 months? No  LIVING ENVIRONMENT: Lives with: lives alone Lives in: House/apartment Stairs: Yes: Internal: 12 steps; on right going uplives on main level, doesn't need to go up steps  Has following equipment at home: Chiropractor, Environmental consultant - 4 wheeled, and shower chair  OCCUPATION: retired   PLOF: Independent with community mobility without device and Leisure: watches TV, reading, crossword puzzles, uses hand weights for exercise  PATIENT GOALS: reduce back pain, improve stability   NEXT MD VISIT: May 2025  OBJECTIVE:  Note: Objective measures were completed at Evaluation unless otherwise noted.  DIAGNOSTIC FINDINGS:  CT 03/28/24 IMPRESSION: 1. Thoracic spondylosis and degenerative disc disease, with bridging anterior interbody spurring all levels between T7 and T12, and non bridging anterior interbody spurring at T5-6. 2. Mild thoracic kyphosis and dextroconvex thoracic scoliosis. 3.  Aortic Atherosclerosis (ICD10-I70.0).  Coronary atherosclerosis. 4. Suspected cholelithiasis. 5. Prominence of stool throughout the colon compatible with constipation.  PATIENT SURVEYS:  03/07/24: Modified Oswestry 18/45 =40% disability  04/22/24   Modified Oswestry 9 / 50 = 18.0 %  COGNITION: Overall cognitive  status: Within functional limits for tasks assessed     SENSATION: WFL   POSTURE: rounded shoulders, forward head, flexed trunk , and weight shift left  PALPATION: Palpable tenderness over Lt>Rt thoracic paraspinals, Lt rhomboids and lumbar paraspinals   LOWER EXTREMITY MMT:    Rt hip rests in ER  4/5, Lt hip 4+/5, bil knees 4+/5   FUNCTIONAL TESTS:  04/06/24: 5x sit to stand: 20.3 seconds with controlled descent  3 min walk test: 255 feet with 7/10 RPE  03/08/24: 5 times sit to stand: 20.02 seconds with uncontrolled descent 03/16/24: 3 min walk test 264 feet with 8/10 RPE  04/22/24 3 min walk test 390 ft with 8-9/10 RPE  5XSTS 19.16 sec with controlled descent  05/04/24: 5x sit to stand 20 seconds   GAIT: Distance walked: 50 Assistive device utilized: Quad cane large base Level of assistance: Modified independence Comments: ER at Rt hip, flexed trunk  TREATMENT DATE:   05/06/24: NuStep: level 5x 10 minutes- PT present to discuss progress Sit to stand x 10  Seated trunk ext and diagonals x 10 ea 5# kettlebell and following with eyes Seated hip to hip, V's (shoulder/waist/shoulder), ear to ear 5# kettlebell: x20 Steam engines 2# weights 2x10 Biceps curl to OH press 3# x10 reps  Seated lumbar ext with magenta powercord x 10 Long arc quads x 30, marching and ER with 2# added to ankles 2x10 Wall push ups x 10 with CGA Seated triceps dips on yoga blocks x 10   05/04/24: NuStep: level 5x 10 minutes- PT present to discuss progress Sit to stand x5 Seated trunk ext, rotation, diagonals x 10 ea holding ball and following with eyes-5# kettlebell today Seated hip to hip 5# kettlebell: x20 Steam engines 2# weights 2x10 Biceps curl to [facility] press 2# x40 reps  Seated lumbar ext with magenta powercord x 20 Long arc quads, marching and ER with 2# added to ankles 2x10    04/27/24: NuStep: level 5x 10 minutes- PT present to discuss progress Seated press into ball: opp arm/leg 5 hold  2x10 each Seated trunk ext, rotation, diagonals x 10  ea holding ball and following with eyes-added weighted ball (blue) today Seated hip to hip with blue weighted ball: x20 Steam engines 2# weights 2x10 Biceps curl to [facility] press 2# x40 reps  Seated lumbar ext with magenta powercord x 20 Long arc quads, marching and ER with 1.5# added to ankles 2x10 Seated row: blue band 2x10    04/22/24: 3 min walk test 390 ft 5x sit to stand completed  Modified Oswestry completed while on Nustep NuStep: level 5x 10 minutes- PT present to discuss progress Seated press into ball: opp arm/leg 5 hold 2x10 each Seated trunk ext, rotation, diagonals x 10 ea holding ball and following with eyes-added weighted ball (blue) today Seated hip to hip with blue weighted ball: x20 Steam engines 2# weights 2x10 Biceps curl to OH press 2x10    PATIENT EDUCATION:  Education details: [email or web address], HEP update Person educated: Patient Education method: Explanation, Demonstration, and Handouts Education comprehension: verbalized understanding and returned demonstration  HOME EXERCISE PROGRAM: Access Code: [email or web address] URL: [email or web address] Date: 03/23/2024 Prepared by: Burnard  Exercises - Supine Hip Internal and External Rotation  - 1 x daily - 7 x weekly - 3 sets - 10 reps - Seated Scapular Retraction  - 5 x daily - 7 x weekly - 1 sets - 10 reps - 5 hold - Seated Thoracic Flexion and Rotation with Arms Crossed  - 3 x daily - 7 x weekly - 1 sets - 3 reps - 10 hold - Seated Correct Posture  - 1 x daily - 7 x weekly - 3 sets - 10 reps - Correct Standing Posture  - 1 x daily - 7 x weekly - 3 sets - 10 reps - Seated Hamstring Stretch  - 3 x daily - 7 x weekly - 1 sets - 3 reps - 20 hold - Supine Lower Trunk Rotation  - 3 x daily - 7 x weekly - 1 sets - 3 reps - 20 hold - Seated Thoracic Extension Arms Overhead  - 1 x daily - 7 x weekly - 1-2 sets - 10 reps - 2-3 sec hold - Seated Trunk Rotation  - 1 x daily  - 7 x weekly - 1-2 sets - 10 reps - 2-3 sec hold - Seated Diagonal Chops with Medicine Ball  - 1 x daily - 7 x weekly - 1-2 sets - 10 reps - 2-3 hold - Seated Bilateral Shoulder External Rotation with Resistance  - 1 x daily - 7 x weekly - 2 sets - 10 reps - Seated Shoulder Horizontal Abduction with Resistance  - 1 x daily - 7 x weekly - 2 sets - 10 reps ASSESSMENT:  CLINICAL IMPRESSION: Patient with increased pain this morning, but none upon arrival to PT. She was able to increase weight with overhead presses today and we added in some triceps work in standing and sitting to help with pushing up from chairs.     OBJECTIVE IMPAIRMENTS: Abnormal gait, decreased activity tolerance, decreased balance, decreased mobility, difficulty walking, decreased strength, decreased safety awareness, hypomobility, increased muscle spasms, impaired flexibility, improper body mechanics, postural dysfunction, and pain.   ACTIVITY LIMITATIONS: carrying, lifting, standing, squatting, stairs, transfers, and locomotion level  PARTICIPATION LIMITATIONS: meal prep, cleaning, laundry, driving, community activity, and church  PERSONAL FACTORS: Age, Time since onset of injury/illness/exacerbation, and 1-2 comorbidities: Lt hip OA, chronic gait abnormality are also affecting patient's functional outcome.   REHAB POTENTIAL: Good  CLINICAL DECISION MAKING: Evolving/moderate complexity  EVALUATION COMPLEXITY: Moderate  GOALS: Goals reviewed with patient? Yes  SHORT TERM GOALS: Target date: 05/25/2024      Be independent in initial HEP Baseline: Goal status: MET  2.  Ambulate 400 feet with 3 min walk test Baseline: 390 feet with RPE 8-9/10 (04/27/24) Goal status: NEW  3.  Report > or = to 60% reduction in LBP/thoracic pain with ADLs and self-care Baseline: 50% reduction in frequency (05/04/24) Goal status: In progress  4.  Improve LE strength to perform stand to sit with controlled descent x 5 reps   Baseline: able to do this with controlled descent, on toe of Rt foot (04/06/24 Goal status: MET  5.  Report postural corrections at home to reduce lumbar/thoracic stress with standing Baseline: has been using walker to assist, still very challenged with this (05/04/24) Goal status: In progress    LONG TERM GOALS: Target date: 06/29/2024      Be independent in advanced HEP Baseline:  Goal status: in progress   2.  Ambulate 425 feet with 3 min walk test Baseline:310 feet Goal status: INITIAL  3.  Report > or = to 70% reduction in LBP/thoracic pain with ADLs and self-care Baseline: 50% reduced frequency, no change in intensity (05/04/24) Goal status: In progress  4.  Perform 5x sit to stand in < or = to 15 seconds with min UE support to reduce falls risk Baseline: 20 seconds  (05/04/24) Goal status: IN PROGRESS  5.  Improve endurance to perform 3 min walk test with RPE < or = to 5/10 Baseline: 8-9/10 Goal status: INITIAL   PLAN:  PT FREQUENCY: 2x/week  PT DURATION: 8 weeks  PLANNED INTERVENTIONS: 97110-Therapeutic exercises, 97530- Therapeutic activity, 97112- Neuromuscular re-education, 97535- Self Care, 02859- Manual therapy, [phone]- Gait training, [phone]- Canalith repositioning, [id number]- Aquatic Therapy, [phone]- Electrical stimulation (unattended), [phone]- Electrical stimulation (manual), Patient/Family education, Stair training, Taping, Dry Needling, Joint mobilization, Spinal manipulation, Spinal mobilization, Vestibular training, Cryotherapy, and Moist heat.  PLAN FOR NEXT SESSION: DN as indicated, work on posture, sit to stand, lumbar and hip flexibility.     Mliss Cummins, PT  05/06/24 10:52 AM  [facility] [address], [address] [place], [place] [address] Phone # [phone] Fax [phone]

## 2024-05-06 ENCOUNTER — Ambulatory Visit: Admitting: Physical Therapy

## 2024-05-06 ENCOUNTER — Encounter: Payer: Self-pay | Admitting: Physical Therapy

## 2024-05-06 DIAGNOSIS — R2689 Other abnormalities of gait and mobility: Secondary | ICD-10-CM

## 2024-05-06 DIAGNOSIS — M5459 Other low back pain: Secondary | ICD-10-CM | POA: Diagnosis not present

## 2024-05-06 DIAGNOSIS — R252 Cramp and spasm: Secondary | ICD-10-CM | POA: Diagnosis not present

## 2024-05-06 DIAGNOSIS — M6281 Muscle weakness (generalized): Secondary | ICD-10-CM

## 2024-05-06 DIAGNOSIS — M546 Pain in thoracic spine: Secondary | ICD-10-CM | POA: Diagnosis not present

## 2024-05-11 ENCOUNTER — Ambulatory Visit

## 2024-05-11 DIAGNOSIS — R252 Cramp and spasm: Secondary | ICD-10-CM | POA: Diagnosis not present

## 2024-05-11 DIAGNOSIS — M6281 Muscle weakness (generalized): Secondary | ICD-10-CM | POA: Diagnosis not present

## 2024-05-11 DIAGNOSIS — R2689 Other abnormalities of gait and mobility: Secondary | ICD-10-CM | POA: Diagnosis not present

## 2024-05-11 DIAGNOSIS — M546 Pain in thoracic spine: Secondary | ICD-10-CM

## 2024-05-11 DIAGNOSIS — M5459 Other low back pain: Secondary | ICD-10-CM | POA: Diagnosis not present

## 2024-05-11 NOTE — Therapy (Signed)
 OUTPATIENT PHYSICAL THERAPY TREATMENT   Patient Name: Kerry Wright MRN: [id number] DOB:[birth year], 83 y.o., female Today's Date: 05/11/2024    END OF SESSION:  PT End of Session - 05/11/24 1102     Visit Number 15    Date for PT Re-Evaluation 06/29/24    Authorization Type [employer] Medicare    Progress Note Due on Visit 20    PT Start Time 1017    PT Stop Time 1100    PT Time Calculation (min) 43 min    Activity Tolerance Patient tolerated treatment well    Behavior During Therapy WFL for tasks assessed/performed                       Past Medical History:  Diagnosis Date   Complication of anesthesia    Headache(784.0)    hx. migraines - less occurring now.   History of kidney stones    remains with kidney stones(not a problem at this time)   Hypertension    Hyperthyroidism    hx. Graves disease 'tx.with radioactive iodine   Osteoarthritis    PONV (postoperative nausea and vomiting)    Past Surgical History:  Procedure Laterality Date   COLONOSCOPY WITH PROPOFOL  N/A 07/31/2014   Procedure: COLONOSCOPY WITH PROPOFOL ;  Surgeon: Gladis MARLA Louder, MD;  Location: [facility];  Service: Endoscopy;  Laterality: N/A;   TONSILLECTOMY     child   There are no active problems to display for this patient.   REFERRING PROVIDER/PCP: Dwight Ave, MD   REFERRING DIAG: M54.9 (ICD-10-CM) - Back pain, Rt sided thoracic pain with muscle spasms  Rationale for Evaluation and Treatment: Rehabilitation  THERAPY DIAG:  Pain in thoracic spine  Other low back pain  Other abnormalities of gait and mobility  Muscle weakness (generalized)  Cramp and spasm  ONSET DATE: 4 month history   SUBJECTIVE:                                                                                                                                                                                           SUBJECTIVE STATEMENT:  I have a bad headache today. I had 8-9/10 pain in by back  this morning.  From Evaluation: Pt present to PT with Rt>Lt thoracic pain and spasms that began 10/2023. MD has prescribed muscle relaxers and is not sure if it is helping. She walks with a quad cane with significant trunk flexion.     PERTINENT HISTORY:  HTN, Rt hip OA (needs THA), migraines   PAIN: 05/04/24 Are you having pain? Yes: NPRS scale: 0/10 Pain location: bil thoracic and low  back  Pain description: shooting, aching  Aggravating factors: early morning, bending over to reach, sometimes just sitting still , random  Relieving factors: pain patch, Aleve, Excedrin  PRECAUTIONS: Fall  RED FLAGS: None   WEIGHT BEARING RESTRICTIONS: No  FALLS:  Has patient fallen in last 6 months? No  LIVING ENVIRONMENT: Lives with: lives alone Lives in: House/apartment Stairs: Yes: Internal: 12 steps; on right going uplives on main level, doesn't need to go up steps  Has following equipment at home: Chiropractor, Environmental consultant - 4 wheeled, and shower chair  OCCUPATION: retired   PLOF: Independent with community mobility without device and Leisure: watches TV, reading, crossword puzzles, uses hand weights for exercise  PATIENT GOALS: reduce back pain, improve stability   NEXT MD VISIT: May 2025  OBJECTIVE:  Note: Objective measures were completed at Evaluation unless otherwise noted.  DIAGNOSTIC FINDINGS:  CT 03/28/24 IMPRESSION: 1. Thoracic spondylosis and degenerative disc disease, with bridging anterior interbody spurring all levels between T7 and T12, and non bridging anterior interbody spurring at T5-6. 2. Mild thoracic kyphosis and dextroconvex thoracic scoliosis. 3.  Aortic Atherosclerosis (ICD10-I70.0).  Coronary atherosclerosis. 4. Suspected cholelithiasis. 5. Prominence of stool throughout the colon compatible with constipation.  PATIENT SURVEYS:  03/07/24: Modified Oswestry 18/45 =40% disability  04/22/24   Modified Oswestry 9 / 50 = 18.0 %  COGNITION: Overall  cognitive status: Within functional limits for tasks assessed     SENSATION: WFL   POSTURE: rounded shoulders, forward head, flexed trunk , and weight shift left  PALPATION: Palpable tenderness over Lt>Rt thoracic paraspinals, Lt rhomboids and lumbar paraspinals   LOWER EXTREMITY MMT:    Rt hip rests in ER  4/5, Lt hip 4+/5, bil knees 4+/5   FUNCTIONAL TESTS:  04/06/24: 5x sit to stand: 20.3 seconds with controlled descent  3 min walk test: 255 feet with 7/10 RPE  03/08/24: 5 times sit to stand: 20.02 seconds with uncontrolled descent 03/16/24: 3 min walk test 264 feet with 8/10 RPE  04/22/24 3 min walk test 390 ft with 8-9/10 RPE  5XSTS 19.16 sec with controlled descent  05/04/24: 5x sit to stand 20 seconds   GAIT: Distance walked: 50 Assistive device utilized: Quad cane large base Level of assistance: Modified independence Comments: ER at Rt hip, flexed trunk  TREATMENT DATE:   05/11/24: NuStep: level 5x 10 minutes- PT present to discuss progress Sit to stand x 10  Seated trunk ext and diagonals x 10 ea 5# kettlebell and following with eyes Seated hip to hip, V's (shoulder/waist/shoulder), ear to ear 5# kettlebell: x20 Steam engines 3 # weights 2x10 Biceps curl to OH press 3# x10 reps  Seated lumbar ext with magenta powercord x 10 Long arc quads x 30, marching and ER with 2# added to ankles 2x10 Seated triceps dips on yoga blocks x 20 Gait with walker up and down hallway with seated rest break at 1/2 way   05/06/24: NuStep: level 5x 10 minutes- PT present to discuss progress Sit to stand x 10  Seated trunk ext and diagonals x 10 ea 5# kettlebell and following with eyes Seated hip to hip, V's (shoulder/waist/shoulder), ear to ear 5# kettlebell: x20 Steam engines 2# weights 2x10 Biceps curl to [facility] press 3# x10 reps  Seated lumbar ext with magenta powercord x 10 Long arc quads x 30, marching and ER with 2# added to ankles 2x10 Wall push ups x 10 with CGA Seated  triceps dips on yoga blocks x  10   05/04/24: NuStep: level 5x 10 minutes- PT present to discuss progress Sit to stand x5 Seated trunk ext, rotation, diagonals x 10 ea holding ball and following with eyes-5# kettlebell today Seated hip to hip 5# kettlebell: x20 Steam engines 2# weights 2x10 Biceps curl to OH press 2# x40 reps  Seated lumbar ext with magenta powercord x 20 Long arc quads, marching and ER with 2# added to ankles 2x10     PATIENT EDUCATION:  Education details: [email or web address], HEP update Person educated: Patient Education method: Explanation, Demonstration, and Handouts Education comprehension: verbalized understanding and returned demonstration  HOME EXERCISE PROGRAM: Access Code: [email or web address] URL: [email or web address] Date: 03/23/2024 Prepared by: Burnard  Exercises - Supine Hip Internal and External Rotation  - 1 x daily - 7 x weekly - 3 sets - 10 reps - Seated Scapular Retraction  - 5 x daily - 7 x weekly - 1 sets - 10 reps - 5 hold - Seated Thoracic Flexion and Rotation with Arms Crossed  - 3 x daily - 7 x weekly - 1 sets - 3 reps - 10 hold - Seated Correct Posture  - 1 x daily - 7 x weekly - 3 sets - 10 reps - Correct Standing Posture  - 1 x daily - 7 x weekly - 3 sets - 10 reps - Seated Hamstring Stretch  - 3 x daily - 7 x weekly - 1 sets - 3 reps - 20 hold - Supine Lower Trunk Rotation  - 3 x daily - 7 x weekly - 1 sets - 3 reps - 20 hold - Seated Thoracic Extension Arms Overhead  - 1 x daily - 7 x weekly - 1-2 sets - 10 reps - 2-3 sec hold - Seated Trunk Rotation  - 1 x daily - 7 x weekly - 1-2 sets - 10 reps - 2-3 sec hold - Seated Diagonal Chops with Medicine Ball  - 1 x daily - 7 x weekly - 1-2 sets - 10 reps - 2-3 hold - Seated Bilateral Shoulder External Rotation with Resistance  - 1 x daily - 7 x weekly - 2 sets - 10 reps - Seated Shoulder Horizontal Abduction with Resistance  - 1 x daily - 7 x weekly - 2 sets - 10 reps ASSESSMENT:  CLINICAL  IMPRESSION: Pt continues to work on strength and endurance.  She is able to do more in the clinic with less fatigue.  She continues to be challenged due to posture. PT monitored for technique, pain and to provide verbal cues.  Patient will benefit from skilled PT to address the below impairments and improve overall function.     OBJECTIVE IMPAIRMENTS: Abnormal gait, decreased activity tolerance, decreased balance, decreased mobility, difficulty walking, decreased strength, decreased safety awareness, hypomobility, increased muscle spasms, impaired flexibility, improper body mechanics, postural dysfunction, and pain.   ACTIVITY LIMITATIONS: carrying, lifting, standing, squatting, stairs, transfers, and locomotion level  PARTICIPATION LIMITATIONS: meal prep, cleaning, laundry, driving, community activity, and church  PERSONAL FACTORS: Age, Time since onset of injury/illness/exacerbation, and 1-2 comorbidities: Lt hip OA, chronic gait abnormality are also affecting patient's functional outcome.   REHAB POTENTIAL: Good  CLINICAL DECISION MAKING: Evolving/moderate complexity  EVALUATION COMPLEXITY: Moderate   GOALS: Goals reviewed with patient? Yes  SHORT TERM GOALS: Target date: 05/25/2024      Be independent in initial HEP Baseline: Goal status: MET  2.  Ambulate 400 feet with 3 min walk test Baseline: 390 feet with RPE 8-9/10 (04/27/24) Goal status:  NEW  3.  Report > or = to 60% reduction in LBP/thoracic pain with ADLs and self-care Baseline: 50% reduction in frequency (05/04/24) Goal status: In progress  4.  Improve LE strength to perform stand to sit with controlled descent x 5 reps  Baseline: able to do this with controlled descent, on toe of Rt foot (04/06/24 Goal status: MET  5.  Report postural corrections at home to reduce lumbar/thoracic stress with standing Baseline: has been using walker to assist, still very challenged with this (05/04/24) Goal status: In progress     LONG TERM GOALS: Target date: 06/29/2024      Be independent in advanced HEP Baseline:  Goal status: in progress   2.  Ambulate 425 feet with 3 min walk test Baseline:310 feet Goal status: INITIAL  3.  Report > or = to 70% reduction in LBP/thoracic pain with ADLs and self-care Baseline: 50% reduced frequency, no change in intensity (05/04/24) Goal status: In progress  4.  Perform 5x sit to stand in < or = to 15 seconds with min UE support to reduce falls risk Baseline: 20 seconds  (05/04/24) Goal status: IN PROGRESS  5.  Improve endurance to perform 3 min walk test with RPE < or = to 5/10 Baseline: 8-9/10 Goal status: INITIAL   PLAN:  PT FREQUENCY: 2x/week  PT DURATION: 8 weeks  PLANNED INTERVENTIONS: 97110-Therapeutic exercises, 97530- Therapeutic activity, 97112- Neuromuscular re-education, 97535- Self Care, 02859- Manual therapy, [phone]- Gait training, [phone]- Canalith repositioning, [id number]- Aquatic Therapy, [phone]- Electrical stimulation (unattended), [phone]- Electrical stimulation (manual), Patient/Family education, Stair training, Taping, Dry Needling, Joint mobilization, Spinal manipulation, Spinal mobilization, Vestibular training, Cryotherapy, and Moist heat.  PLAN FOR NEXT SESSION: DN as indicated, work on posture, sit to stand, lumbar and hip flexibility.     Burnard Joy, PT 05/11/24 11:02 AM  [facility] [address], [address] [place], [place] [address] Phone # [phone] Fax [phone]

## 2024-05-16 ENCOUNTER — Ambulatory Visit

## 2024-05-16 DIAGNOSIS — M546 Pain in thoracic spine: Secondary | ICD-10-CM

## 2024-05-16 DIAGNOSIS — R252 Cramp and spasm: Secondary | ICD-10-CM

## 2024-05-16 DIAGNOSIS — R2689 Other abnormalities of gait and mobility: Secondary | ICD-10-CM | POA: Diagnosis not present

## 2024-05-16 DIAGNOSIS — M6281 Muscle weakness (generalized): Secondary | ICD-10-CM

## 2024-05-16 DIAGNOSIS — M5459 Other low back pain: Secondary | ICD-10-CM

## 2024-05-16 NOTE — Therapy (Signed)
 OUTPATIENT PHYSICAL THERAPY TREATMENT   Patient Name: Kerry Wright MRN: [id number] DOB:1941-03-07, 83 y.o., female Today's Date: 05/16/2024    END OF SESSION:  PT End of Session - 05/16/24 1234     Visit Number 16    Date for PT Re-Evaluation 06/29/24    Authorization Type [employer]    Progress Note Due on Visit 20    PT Start Time 1147    PT Stop Time 1229    PT Time Calculation (min) 42 min    Activity Tolerance Patient tolerated treatment well    Behavior During Therapy WFL for tasks assessed/performed                        Past Medical History:  Diagnosis Date   Complication of anesthesia    Headache(784.0)    hx. migraines - less occurring now.   History of kidney stones    remains with kidney stones(not a problem at this time)   Hypertension    Hyperthyroidism    hx. Graves disease 'tx.with radioactive iodine   Osteoarthritis    PONV (postoperative nausea and vomiting)    Past Surgical History:  Procedure Laterality Date   COLONOSCOPY WITH PROPOFOL  N/A 07/31/2014   Procedure: COLONOSCOPY WITH PROPOFOL ;  Surgeon: Gladis MARLA Louder, MD;  Location: [facility];  Service: Endoscopy;  Laterality: N/A;   TONSILLECTOMY     child   There are no active problems to display for this patient.   REFERRING PROVIDER/PCP: Dwight Ave, MD   REFERRING DIAG: M54.9 (ICD-10-CM) - Back pain, Rt sided thoracic pain with muscle spasms  Rationale for Evaluation and Treatment: Rehabilitation  THERAPY DIAG:  Pain in thoracic spine  Other low back pain  Other abnormalities of gait and mobility  Muscle weakness (generalized)  Cramp and spasm  ONSET DATE: 4 month history   SUBJECTIVE:                                                                                                                                                                                           SUBJECTIVE STATEMENT:  I have a bad headache today. I had 8-9/10 pain in by back  this morning.  From Evaluation: Pt present to PT with Rt>Lt thoracic pain and spasms that began 10/2023. MD has prescribed muscle relaxers and is not sure if it is helping. She walks with a quad cane with significant trunk flexion.     PERTINENT HISTORY:  HTN, Rt hip OA (needs THA), migraines   PAIN: 05/04/24 Are you having pain? Yes: NPRS scale: 0/10 Pain location: bil thoracic and  low back  Pain description: shooting, aching  Aggravating factors: early morning, bending over to reach, sometimes just sitting still , random  Relieving factors: pain patch, Aleve, Excedrin  PRECAUTIONS: Fall  RED FLAGS: None   WEIGHT BEARING RESTRICTIONS: No  FALLS:  Has patient fallen in last 6 months? No  LIVING ENVIRONMENT: Lives with: lives alone Lives in: House/apartment Stairs: Yes: Internal: 12 steps; on right going uplives on main level, doesn't need to go up steps  Has following equipment at home: Chiropractor, Environmental consultant - 4 wheeled, and shower chair  OCCUPATION: retired   PLOF: Independent with community mobility without device and Leisure: watches TV, reading, crossword puzzles, uses hand weights for exercise  PATIENT GOALS: reduce back pain, improve stability   NEXT MD VISIT: May 2025  OBJECTIVE:  Note: Objective measures were completed at Evaluation unless otherwise noted.  DIAGNOSTIC FINDINGS:  CT 03/28/24 IMPRESSION: 1. Thoracic spondylosis and degenerative disc disease, with bridging anterior interbody spurring all levels between T7 and T12, and non bridging anterior interbody spurring at T5-6. 2. Mild thoracic kyphosis and dextroconvex thoracic scoliosis. 3.  Aortic Atherosclerosis (ICD10-I70.0).  Coronary atherosclerosis. 4. Suspected cholelithiasis. 5. Prominence of stool throughout the colon compatible with constipation.  PATIENT SURVEYS:  03/07/24: Modified Oswestry 18/45 =40% disability  04/22/24   Modified Oswestry 9 / 50 = 18.0 %  COGNITION: Overall  cognitive status: Within functional limits for tasks assessed     SENSATION: WFL   POSTURE: rounded shoulders, forward head, flexed trunk , and weight shift left  PALPATION: Palpable tenderness over Lt>Rt thoracic paraspinals, Lt rhomboids and lumbar paraspinals   LOWER EXTREMITY MMT:    Rt hip rests in ER  4/5, Lt hip 4+/5, bil knees 4+/5   FUNCTIONAL TESTS:  04/06/24: 5x sit to stand: 20.3 seconds with controlled descent  3 min walk test: 255 feet with 7/10 RPE  03/08/24: 5 times sit to stand: 20.02 seconds with uncontrolled descent 03/16/24: 3 min walk test 264 feet with 8/10 RPE  04/22/24 3 min walk test 390 ft with 8-9/10 RPE  5XSTS 19.16 sec with controlled descent  05/04/24: 5x sit to stand 20 seconds   GAIT: Distance walked: 50 Assistive device utilized: Quad cane large base Level of assistance: Modified independence Comments: ER at Rt hip, flexed trunk  TREATMENT DATE:   05/16/24: NuStep: level 5x 10 minutes- PT present to discuss progress Sit to stand 2x5  Seated trunk ext and diagonals x 10 ea 5# kettlebell and following with eyes Seated hip to hip, V's (shoulder/waist/shoulder), ear to ear 5# kettlebell: x20 Steam engines 3 # weights 2x10 Biceps curl to OH press 3# x10 reps  Seated lumbar ext with magenta powercord x 10 Long arc quads x 30, marching and ER with 2.5# added to ankles 2x10 Seated triceps dips on yoga blocks x 20  05/11/24: NuStep: level 5x 10 minutes- PT present to discuss progress Sit to stand x 10  Seated trunk ext and diagonals x 10 ea 5# kettlebell and following with eyes Seated hip to hip, V's (shoulder/waist/shoulder), ear to ear 5# kettlebell: x20 Steam engines 3 # weights 2x10 Biceps curl to OH press 3# x10 reps  Seated lumbar ext with magenta powercord x 10 Long arc quads x 30, marching and ER with 2# added to ankles 2x10 Seated triceps dips on yoga blocks x 20 Gait with walker up and down hallway with seated rest break at 1/2  way   05/06/24: NuStep: level 5x  10 minutes- PT present to discuss progress Sit to stand x 10  Seated trunk ext and diagonals x 10 ea 5# kettlebell and following with eyes Seated hip to hip, V's (shoulder/waist/shoulder), ear to ear 5# kettlebell: x20 Steam engines 2# weights 2x10 Biceps curl to OH press 3# x10 reps  Seated lumbar ext with magenta powercord x 10 Long arc quads x 30, marching and ER with 2# added to ankles 2x10 Wall push ups x 10 with CGA Seated triceps dips on yoga blocks x 10   PATIENT EDUCATION:  Education details: [email or web address], HEP update Person educated: Patient Education method: Explanation, Demonstration, and Handouts Education comprehension: verbalized understanding and returned demonstration  HOME EXERCISE PROGRAM: Access Code: [email or web address] URL: [email or web address] Date: 03/23/2024 Prepared by: Burnard  Exercises - Supine Hip Internal and External Rotation  - 1 x daily - 7 x weekly - 3 sets - 10 reps - Seated Scapular Retraction  - 5 x daily - 7 x weekly - 1 sets - 10 reps - 5 hold - Seated Thoracic Flexion and Rotation with Arms Crossed  - 3 x daily - 7 x weekly - 1 sets - 3 reps - 10 hold - Seated Correct Posture  - 1 x daily - 7 x weekly - 3 sets - 10 reps - Correct Standing Posture  - 1 x daily - 7 x weekly - 3 sets - 10 reps - Seated Hamstring Stretch  - 3 x daily - 7 x weekly - 1 sets - 3 reps - 20 hold - Supine Lower Trunk Rotation  - 3 x daily - 7 x weekly - 1 sets - 3 reps - 20 hold - Seated Thoracic Extension Arms Overhead  - 1 x daily - 7 x weekly - 1-2 sets - 10 reps - 2-3 sec hold - Seated Trunk Rotation  - 1 x daily - 7 x weekly - 1-2 sets - 10 reps - 2-3 sec hold - Seated Diagonal Chops with Medicine Ball  - 1 x daily - 7 x weekly - 1-2 sets - 10 reps - 2-3 hold - Seated Bilateral Shoulder External Rotation with Resistance  - 1 x daily - 7 x weekly - 2 sets - 10 reps - Seated Shoulder Horizontal Abduction with Resistance  - 1 x  daily - 7 x weekly - 2 sets - 10 reps ASSESSMENT:  CLINICAL IMPRESSION: Pt continues to benefit from strength and endurance training.  Pt did well with advancement of weights on her ankles today. She continues to be challenged by her current level of exercise. PT monitored for technique, pain and to provide verbal cues.  Patient will benefit from skilled PT to address the below impairments and improve overall function.     OBJECTIVE IMPAIRMENTS: Abnormal gait, decreased activity tolerance, decreased balance, decreased mobility, difficulty walking, decreased strength, decreased safety awareness, hypomobility, increased muscle spasms, impaired flexibility, improper body mechanics, postural dysfunction, and pain.   ACTIVITY LIMITATIONS: carrying, lifting, standing, squatting, stairs, transfers, and locomotion level  PARTICIPATION LIMITATIONS: meal prep, cleaning, laundry, driving, community activity, and church  PERSONAL FACTORS: Age, Time since onset of injury/illness/exacerbation, and 1-2 comorbidities: Lt hip OA, chronic gait abnormality are also affecting patient's functional outcome.   REHAB POTENTIAL: Good  CLINICAL DECISION MAKING: Evolving/moderate complexity  EVALUATION COMPLEXITY: Moderate   GOALS: Goals reviewed with patient? Yes  SHORT TERM GOALS: Target date: 05/25/2024      Be independent in initial HEP Baseline: Goal status: MET  2.  Ambulate 400  feet with 3 min walk test Baseline: 390 feet with RPE 8-9/10 (04/27/24) Goal status: NEW  3.  Report > or = to 60% reduction in LBP/thoracic pain with ADLs and self-care Baseline: 50% reduction in frequency (05/04/24) Goal status: In progress  4.  Improve LE strength to perform stand to sit with controlled descent x 5 reps  Baseline: able to do this with controlled descent, on toe of Rt foot (04/06/24 Goal status: MET  5.  Report postural corrections at home to reduce lumbar/thoracic stress with standing Baseline: has  been using walker to assist, still very challenged with this (05/04/24) Goal status: In progress    LONG TERM GOALS: Target date: 06/29/2024      Be independent in advanced HEP Baseline:  Goal status: in progress   2.  Ambulate 425 feet with 3 min walk test Baseline:310 feet Goal status: INITIAL  3.  Report > or = to 70% reduction in LBP/thoracic pain with ADLs and self-care Baseline: 50% reduced frequency, no change in intensity (05/04/24) Goal status: In progress  4.  Perform 5x sit to stand in < or = to 15 seconds with min UE support to reduce falls risk Baseline: 20 seconds  (05/04/24) Goal status: IN PROGRESS  5.  Improve endurance to perform 3 min walk test with RPE < or = to 5/10 Baseline: 8-9/10 Goal status: INITIAL   PLAN:  PT FREQUENCY: 2x/week  PT DURATION: 8 weeks  PLANNED INTERVENTIONS: 97110-Therapeutic exercises, 97530- Therapeutic activity, 97112- Neuromuscular re-education, 97535- Self Care, [id number]- Manual therapy, [phone]- Gait training, [phone]- Canalith repositioning, [id number]- Aquatic Therapy, [phone]- Electrical stimulation (unattended), [phone]- Electrical stimulation (manual), Patient/Family education, Stair training, Taping, Dry Needling, Joint mobilization, Spinal manipulation, Spinal mobilization, Vestibular training, Cryotherapy, and Moist heat.  PLAN FOR NEXT SESSION: DN as indicated, work on posture, sit to stand, lumbar and hip flexibility.     Burnard Joy, PT 05/16/24 12:35 PM  [facility] [address] [place], [place] [address] Phone # [phone] Fax [phone]

## 2024-05-17 ENCOUNTER — Ambulatory Visit: Payer: Self-pay | Attending: Internal Medicine

## 2024-05-17 DIAGNOSIS — R2689 Other abnormalities of gait and mobility: Secondary | ICD-10-CM | POA: Diagnosis not present

## 2024-05-17 DIAGNOSIS — R252 Cramp and spasm: Secondary | ICD-10-CM | POA: Insufficient documentation

## 2024-05-17 DIAGNOSIS — M6281 Muscle weakness (generalized): Secondary | ICD-10-CM | POA: Insufficient documentation

## 2024-05-17 DIAGNOSIS — M5459 Other low back pain: Secondary | ICD-10-CM | POA: Insufficient documentation

## 2024-05-17 DIAGNOSIS — M546 Pain in thoracic spine: Secondary | ICD-10-CM | POA: Diagnosis not present

## 2024-05-17 NOTE — Therapy (Signed)
 OUTPATIENT PHYSICAL THERAPY TREATMENT   Patient Name: Kerry Wright MRN: [id number] DOB:12-Sep-1941, 83 y.o., female Today's Date: 05/17/2024    END OF SESSION:  PT End of Session - 05/17/24 1143     Visit Number 17    Date for PT Re-Evaluation 06/29/24    Authorization Type [employer]    Progress Note Due on Visit 20    PT Start Time 1101    PT Stop Time 1143    PT Time Calculation (min) 42 min    Activity Tolerance Patient tolerated treatment well    Behavior During Therapy WFL for tasks assessed/performed                         Past Medical History:  Diagnosis Date   Complication of anesthesia    Headache(784.0)    hx. migraines - less occurring now.   History of kidney stones    remains with kidney stones(not a problem at this time)   Hypertension    Hyperthyroidism    hx. Graves disease 'tx.with radioactive iodine   Osteoarthritis    PONV (postoperative nausea and vomiting)    Past Surgical History:  Procedure Laterality Date   COLONOSCOPY WITH PROPOFOL  N/A 07/31/2014   Procedure: COLONOSCOPY WITH PROPOFOL ;  Surgeon: Gladis MARLA Louder, MD;  Location: [facility];  Service: Endoscopy;  Laterality: N/A;   TONSILLECTOMY     child   There are no active problems to display for this patient.   REFERRING PROVIDER/PCP: Dwight Ave, MD   REFERRING DIAG: M54.9 (ICD-10-CM) - Back pain, Rt sided thoracic pain with muscle spasms  Rationale for Evaluation and Treatment: Rehabilitation  THERAPY DIAG:  Pain in thoracic spine  Other low back pain  Other abnormalities of gait and mobility  Muscle weakness (generalized)  Cramp and spasm  ONSET DATE: 4 month history   SUBJECTIVE:                                                                                                                                                                                           SUBJECTIVE STATEMENT:  I still have a headache.  Mild spasms this morning in my  back.    From Evaluation: Pt present to PT with Rt>Lt thoracic pain and spasms that began 10/2023. MD has prescribed muscle relaxers and is not sure if it is helping. She walks with a quad cane with significant trunk flexion.     PERTINENT HISTORY:  HTN, Rt hip OA (needs THA), migraines   PAIN: 05/04/24 Are you having pain? Yes: NPRS scale: 0/10 Pain location: bil thoracic  and low back  Pain description: shooting, aching  Aggravating factors: early morning, bending over to reach, sometimes just sitting still , random  Relieving factors: pain patch, Aleve, Excedrin  PRECAUTIONS: Fall  RED FLAGS: None   WEIGHT BEARING RESTRICTIONS: No  FALLS:  Has patient fallen in last 6 months? No  LIVING ENVIRONMENT: Lives with: lives alone Lives in: House/apartment Stairs: Yes: Internal: 12 steps; on right going uplives on main level, doesn't need to go up steps  Has following equipment at home: Chiropractor, Environmental consultant - 4 wheeled, and shower chair  OCCUPATION: retired   PLOF: Independent with community mobility without device and Leisure: watches TV, reading, crossword puzzles, uses hand weights for exercise  PATIENT GOALS: reduce back pain, improve stability   NEXT MD VISIT: May 2025  OBJECTIVE:  Note: Objective measures were completed at Evaluation unless otherwise noted.  DIAGNOSTIC FINDINGS:  CT 03/28/24 IMPRESSION: 1. Thoracic spondylosis and degenerative disc disease, with bridging anterior interbody spurring all levels between T7 and T12, and non bridging anterior interbody spurring at T5-6. 2. Mild thoracic kyphosis and dextroconvex thoracic scoliosis. 3.  Aortic Atherosclerosis (ICD10-I70.0).  Coronary atherosclerosis. 4. Suspected cholelithiasis. 5. Prominence of stool throughout the colon compatible with constipation.  PATIENT SURVEYS:  03/07/24: Modified Oswestry 18/45 =40% disability  04/22/24   Modified Oswestry 9 / 50 = 18.0 %  COGNITION: Overall cognitive  status: Within functional limits for tasks assessed     SENSATION: WFL   POSTURE: rounded shoulders, forward head, flexed trunk , and weight shift left  PALPATION: Palpable tenderness over Lt>Rt thoracic paraspinals, Lt rhomboids and lumbar paraspinals   LOWER EXTREMITY MMT:    Rt hip rests in ER  4/5, Lt hip 4+/5, bil knees 4+/5   FUNCTIONAL TESTS:  04/06/24: 5x sit to stand: 20.3 seconds with controlled descent  3 min walk test: 255 feet with 7/10 RPE  03/08/24: 5 times sit to stand: 20.02 seconds with uncontrolled descent 03/16/24: 3 min walk test 264 feet with 8/10 RPE  04/22/24 3 min walk test 390 ft with 8-9/10 RPE  5XSTS 19.16 sec with controlled descent  05/04/24: 5x sit to stand 20 seconds   GAIT: Distance walked: 50 Assistive device utilized: Quad cane large base Level of assistance: Modified independence Comments: ER at Rt hip, flexed trunk  TREATMENT DATE:    05/17/24: NuStep: level 5x 10 minutes- PT present to discuss progress Sit to stand 2x5  Seated trunk ext and diagonals x 10 ea 5# kettlebell and following with eyes Seated hip to hip, V's (shoulder/waist/shoulder), ear to ear 5# kettlebell: x20 Steam engines 3 # weights 2x10 Biceps curl to OH press 3# x10 reps  Seated lumbar ext with magenta powercord x 10 Long arc quads x 30, marching and ER with 2.5# added to ankles 2x10 Seated triceps dips on yoga blocks x 20  05/16/24: NuStep: level 5x 10 minutes- PT present to discuss progress Sit to stand 2x5  Seated trunk ext and diagonals x 10 ea 5# kettlebell and following with eyes Seated hip to hip, V's (shoulder/waist/shoulder), ear to ear 5# kettlebell: x20 Steam engines 3 # weights 2x10 Biceps curl to [facility] press 3# x10 reps  Seated lumbar ext with magenta powercord x 10 Long arc quads x 30, marching and ER with 2.5# added to ankles 2x10 Seated triceps dips on yoga blocks x 20  05/11/24: NuStep: level 5x 10 minutes- PT present to discuss progress Sit to  stand x 10  Seated  trunk ext and diagonals x 10 ea 5# kettlebell and following with eyes Seated hip to hip, V's (shoulder/waist/shoulder), ear to ear 5# kettlebell: x20 Steam engines 3 # weights 2x10 Biceps curl to OH press 3# x10 reps  Seated lumbar ext with magenta powercord x 10 Long arc quads x 30, marching and ER with 2# added to ankles 2x10 Seated triceps dips on yoga blocks x 20 Gait with walker up and down hallway with seated rest break at 1/2 way    PATIENT EDUCATION:  Education details: [email or web address], HEP update Person educated: Patient Education method: Explanation, Demonstration, and Handouts Education comprehension: verbalized understanding and returned demonstration  HOME EXERCISE PROGRAM: Access Code: [email or web address] URL: [email or web address] Date: 03/23/2024 Prepared by: Burnard  Exercises - Supine Hip Internal and External Rotation  - 1 x daily - 7 x weekly - 3 sets - 10 reps - Seated Scapular Retraction  - 5 x daily - 7 x weekly - 1 sets - 10 reps - 5 hold - Seated Thoracic Flexion and Rotation with Arms Crossed  - 3 x daily - 7 x weekly - 1 sets - 3 reps - 10 hold - Seated Correct Posture  - 1 x daily - 7 x weekly - 3 sets - 10 reps - Correct Standing Posture  - 1 x daily - 7 x weekly - 3 sets - 10 reps - Seated Hamstring Stretch  - 3 x daily - 7 x weekly - 1 sets - 3 reps - 20 hold - Supine Lower Trunk Rotation  - 3 x daily - 7 x weekly - 1 sets - 3 reps - 20 hold - Seated Thoracic Extension Arms Overhead  - 1 x daily - 7 x weekly - 1-2 sets - 10 reps - 2-3 sec hold - Seated Trunk Rotation  - 1 x daily - 7 x weekly - 1-2 sets - 10 reps - 2-3 sec hold - Seated Diagonal Chops with Medicine Ball  - 1 x daily - 7 x weekly - 1-2 sets - 10 reps - 2-3 hold - Seated Bilateral Shoulder External Rotation with Resistance  - 1 x daily - 7 x weekly - 2 sets - 10 reps - Seated Shoulder Horizontal Abduction with Resistance  - 1 x daily - 7 x weekly - 2 sets - 10  reps ASSESSMENT:  CLINICAL IMPRESSION: Pt was here yesterday and didn't experience too much fatigue after her workout. She continues to be challenged by her current level of exercise. PT monitored for technique, pain and to provide verbal cues.  Patient will benefit from skilled PT to address the below impairments and improve overall function.     OBJECTIVE IMPAIRMENTS: Abnormal gait, decreased activity tolerance, decreased balance, decreased mobility, difficulty walking, decreased strength, decreased safety awareness, hypomobility, increased muscle spasms, impaired flexibility, improper body mechanics, postural dysfunction, and pain.   ACTIVITY LIMITATIONS: carrying, lifting, standing, squatting, stairs, transfers, and locomotion level  PARTICIPATION LIMITATIONS: meal prep, cleaning, laundry, driving, community activity, and church  PERSONAL FACTORS: Age, Time since onset of injury/illness/exacerbation, and 1-2 comorbidities: Lt hip OA, chronic gait abnormality are also affecting patient's functional outcome.   REHAB POTENTIAL: Good  CLINICAL DECISION MAKING: Evolving/moderate complexity  EVALUATION COMPLEXITY: Moderate   GOALS: Goals reviewed with patient? Yes  SHORT TERM GOALS: Target date: 05/25/2024      Be independent in initial HEP Baseline: Goal status: MET  2.  Ambulate 400 feet with 3 min walk test Baseline: 390 feet with RPE 8-9/10 (04/27/24)  Goal status: NEW  3.  Report > or = to 60% reduction in LBP/thoracic pain with ADLs and self-care Baseline: 50% reduction in frequency (05/04/24) Goal status: In progress  4.  Improve LE strength to perform stand to sit with controlled descent x 5 reps  Baseline: able to do this with controlled descent, on toe of Rt foot (04/06/24 Goal status: MET  5.  Report postural corrections at home to reduce lumbar/thoracic stress with standing Baseline: has been using walker to assist, still very challenged with this (05/04/24) Goal  status: In progress    LONG TERM GOALS: Target date: 06/29/2024      Be independent in advanced HEP Baseline:  Goal status: in progress   2.  Ambulate 425 feet with 3 min walk test Baseline:310 feet Goal status: INITIAL  3.  Report > or = to 70% reduction in LBP/thoracic pain with ADLs and self-care Baseline: 50% reduced frequency, no change in intensity (05/04/24) Goal status: In progress  4.  Perform 5x sit to stand in < or = to 15 seconds with min UE support to reduce falls risk Baseline: 20 seconds  (05/04/24) Goal status: IN PROGRESS  5.  Improve endurance to perform 3 min walk test with RPE < or = to 5/10 Baseline: 8-9/10 Goal status: INITIAL   PLAN:  PT FREQUENCY: 2x/week  PT DURATION: 8 weeks  PLANNED INTERVENTIONS: 97110-Therapeutic exercises, 97530- Therapeutic activity, 97112- Neuromuscular re-education, 97535- Self Care, 02859- Manual therapy, [phone]- Gait training, [phone]- Canalith repositioning, [id number]- Aquatic Therapy, [phone]- Electrical stimulation (unattended), [phone]- Electrical stimulation (manual), Patient/Family education, Stair training, Taping, Dry Needling, Joint mobilization, Spinal manipulation, Spinal mobilization, Vestibular training, Cryotherapy, and Moist heat.  PLAN FOR NEXT SESSION: DN as indicated, work on posture, sit to stand, lumbar and hip flexibility.     Burnard Joy, PT 05/17/24 11:44 AM  [facility] [address], [address] [place], [place] [address] Phone # [phone] Fax [phone]

## 2024-05-23 ENCOUNTER — Ambulatory Visit

## 2024-05-23 DIAGNOSIS — M546 Pain in thoracic spine: Secondary | ICD-10-CM

## 2024-05-23 DIAGNOSIS — M6281 Muscle weakness (generalized): Secondary | ICD-10-CM

## 2024-05-23 DIAGNOSIS — M5459 Other low back pain: Secondary | ICD-10-CM

## 2024-05-23 DIAGNOSIS — R252 Cramp and spasm: Secondary | ICD-10-CM

## 2024-05-23 DIAGNOSIS — R2689 Other abnormalities of gait and mobility: Secondary | ICD-10-CM | POA: Diagnosis not present

## 2024-05-23 NOTE — Therapy (Signed)
 OUTPATIENT PHYSICAL THERAPY TREATMENT   Patient Name: Kerry Wright MRN: [id number] DOB:September 30, 1941, 83 y.o., female Today's Date: 05/23/2024    END OF SESSION:  PT End of Session - 05/23/24 1528     Visit Number 18    Date for PT Re-Evaluation 06/29/24    Authorization Type [employer]    Progress Note Due on Visit 20    PT Start Time 1446    PT Stop Time 1529    PT Time Calculation (min) 43 min    Activity Tolerance Patient tolerated treatment well    Behavior During Therapy WFL for tasks assessed/performed                          Past Medical History:  Diagnosis Date   Complication of anesthesia    Headache(784.0)    hx. migraines - less occurring now.   History of kidney stones    remains with kidney stones(not a problem at this time)   Hypertension    Hyperthyroidism    hx. Graves disease 'tx.with radioactive iodine   Osteoarthritis    PONV (postoperative nausea and vomiting)    Past Surgical History:  Procedure Laterality Date   COLONOSCOPY WITH PROPOFOL  N/A 07/31/2014   Procedure: COLONOSCOPY WITH PROPOFOL ;  Surgeon: Gladis MARLA Louder, MD;  Location: [facility];  Service: Endoscopy;  Laterality: N/A;   TONSILLECTOMY     child   There are no active problems to display for this patient.   REFERRING PROVIDER/PCP: Dwight Ave, MD   REFERRING DIAG: M54.9 (ICD-10-CM) - Back pain, Rt sided thoracic pain with muscle spasms  Rationale for Evaluation and Treatment: Rehabilitation  THERAPY DIAG:  Pain in thoracic spine  Other low back pain  Other abnormalities of gait and mobility  Muscle weakness (generalized)  Cramp and spasm  ONSET DATE: 4 month history   SUBJECTIVE:                                                                                                                                                                                           SUBJECTIVE STATEMENT:  I've had a few episodes of spasms but better overall  the past few days.  I am able to get control of them.     From Evaluation: Pt present to PT with Rt>Lt thoracic pain and spasms that began 10/2023. MD has prescribed muscle relaxers and is not sure if it is helping. She walks with a quad cane with significant trunk flexion.     PERTINENT HISTORY:  HTN, Rt hip OA (needs THA), migraines   PAIN: 05/23/24  Are you having pain? Yes: NPRS scale: 0/10 Pain location: bil thoracic and low back  Pain description: shooting, aching  Aggravating factors: early morning, bending over to reach, sometimes just sitting still , random  Relieving factors: pain patch, Aleve, Excedrin  PRECAUTIONS: Fall  RED FLAGS: None   WEIGHT BEARING RESTRICTIONS: No  FALLS:  Has patient fallen in last 6 months? No  LIVING ENVIRONMENT: Lives with: lives alone Lives in: House/apartment Stairs: Yes: Internal: 12 steps; on right going uplives on main level, doesn't need to go up steps  Has following equipment at home: Chiropractor, Environmental consultant - 4 wheeled, and shower chair  OCCUPATION: retired   PLOF: Independent with community mobility without device and Leisure: watches TV, reading, crossword puzzles, uses hand weights for exercise  PATIENT GOALS: reduce back pain, improve stability   NEXT MD VISIT: May 2025  OBJECTIVE:  Note: Objective measures were completed at Evaluation unless otherwise noted.  DIAGNOSTIC FINDINGS:  CT 03/28/24 IMPRESSION: 1. Thoracic spondylosis and degenerative disc disease, with bridging anterior interbody spurring all levels between T7 and T12, and non bridging anterior interbody spurring at T5-6. 2. Mild thoracic kyphosis and dextroconvex thoracic scoliosis. 3.  Aortic Atherosclerosis (ICD10-I70.0).  Coronary atherosclerosis. 4. Suspected cholelithiasis. 5. Prominence of stool throughout the colon compatible with constipation.  PATIENT SURVEYS:  03/07/24: Modified Oswestry 18/45 =40% disability  04/22/24   Modified Oswestry 9 /  50 = 18.0 %  COGNITION: Overall cognitive status: Within functional limits for tasks assessed     SENSATION: WFL   POSTURE: rounded shoulders, forward head, flexed trunk , and weight shift left  PALPATION: Palpable tenderness over Lt>Rt thoracic paraspinals, Lt rhomboids and lumbar paraspinals   LOWER EXTREMITY MMT:    Rt hip rests in ER  4/5, Lt hip 4+/5, bil knees 4+/5   FUNCTIONAL TESTS:  04/06/24: 5x sit to stand: 20.3 seconds with controlled descent  3 min walk test: 255 feet with 7/10 RPE  03/08/24: 5 times sit to stand: 20.02 seconds with uncontrolled descent 03/16/24: 3 min walk test 264 feet with 8/10 RPE  04/22/24 3 min walk test 390 ft with 8-9/10 RPE  5XSTS 19.16 sec with controlled descent  05/04/24: 5x sit to stand 20 seconds   GAIT: Distance walked: 50 Assistive device utilized: Quad cane large base Level of assistance: Modified independence Comments: ER at Rt hip, flexed trunk  TREATMENT DATE:    05/23/24: NuStep: level 5x 10 minutes- PT present to discuss progress Sit to stand 2x5  Seated trunk ext and diagonals x 10 ea 5# kettlebell and following with eyes Seated hip to hip, V's (shoulder/waist/shoulder), ear to ear 5# kettlebell: x20 [employer] 3 # weights 2x10 Biceps curl to OH press 3# x10 reps  Seated lumbar ext with magenta powercord x 10 Long arc quads x 30, marching and ER with 2.5# added to ankles 2x10 Seated triceps dips on yoga blocks x 20 Gait up and down hallway -no seated rest break taken today Press into ball on leg: diagonal with 5 hold x15 each   05/17/24: NuStep: level 5x 10 minutes- PT present to discuss progress Sit to stand 2x5  Seated trunk ext and diagonals x 10 ea 5# kettlebell and following with eyes Seated hip to hip, V's (shoulder/waist/shoulder), ear to ear 5# kettlebell: x20 [employer] 3 # weights 2x10 Biceps curl to OH press 3# x10 reps  Seated lumbar ext with magenta powercord x 10 Long arc quads x 30, marching  and ER with 2.5# added to ankles 2x10 Seated triceps dips on yoga blocks x 20  05/16/24: NuStep: level 5x 10 minutes- PT present to discuss progress Sit to stand 2x5  Seated trunk ext and diagonals x 10 ea 5# kettlebell and following with eyes Seated hip to hip, V's (shoulder/waist/shoulder), ear to ear 5# kettlebell: x20 [employer] 3 # weights 2x10 Biceps curl to OH press 3# x10 reps  Seated lumbar ext with magenta powercord x 10 Long arc quads x 30, marching and ER with 2.5# added to ankles 2x10 Seated triceps dips on yoga blocks x 20   PATIENT EDUCATION:  Education details: [email or web address], HEP update Person educated: Patient Education method: Explanation, Demonstration, and Handouts Education comprehension: verbalized understanding and returned demonstration  HOME EXERCISE PROGRAM: Access Code: [email or web address] URL: [email or web address] Date: 03/23/2024 Prepared by: Burnard  Exercises - Supine Hip Internal and External Rotation  - 1 x daily - 7 x weekly - 3 sets - 10 reps - Seated Scapular Retraction  - 5 x daily - 7 x weekly - 1 sets - 10 reps - 5 hold - Seated Thoracic Flexion and Rotation with Arms Crossed  - 3 x daily - 7 x weekly - 1 sets - 3 reps - 10 hold - Seated Correct Posture  - 1 x daily - 7 x weekly - 3 sets - 10 reps - Correct Standing Posture  - 1 x daily - 7 x weekly - 3 sets - 10 reps - Seated Hamstring Stretch  - 3 x daily - 7 x weekly - 1 sets - 3 reps - 20 hold - Supine Lower Trunk Rotation  - 3 x daily - 7 x weekly - 1 sets - 3 reps - 20 hold - Seated Thoracic Extension Arms Overhead  - 1 x daily - 7 x weekly - 1-2 sets - 10 reps - 2-3 sec hold - Seated Trunk Rotation  - 1 x daily - 7 x weekly - 1-2 sets - 10 reps - 2-3 sec hold - Seated Diagonal Chops with Medicine Ball  - 1 x daily - 7 x weekly - 1-2 sets - 10 reps - 2-3 hold - Seated Bilateral Shoulder External Rotation with Resistance  - 1 x daily - 7 x weekly - 2 sets - 10 reps - Seated Shoulder  Horizontal Abduction with Resistance  - 1 x daily - 7 x weekly - 2 sets - 10 reps ASSESSMENT:  CLINICAL IMPRESSION: Pt with fewer episodes and reduced intensity of back spasms today.  Improved functional mobility today and able to perform sit to stand without UE support and walk down the hall and back and not take a rest break. PT monitored throughout session for fatigue, pain and safety.  Patient will benefit from skilled PT to address the below impairments and improve overall function.     OBJECTIVE IMPAIRMENTS: Abnormal gait, decreased activity tolerance, decreased balance, decreased mobility, difficulty walking, decreased strength, decreased safety awareness, hypomobility, increased muscle spasms, impaired flexibility, improper body mechanics, postural dysfunction, and pain.   ACTIVITY LIMITATIONS: carrying, lifting, standing, squatting, stairs, transfers, and locomotion level  PARTICIPATION LIMITATIONS: meal prep, cleaning, laundry, driving, community activity, and church  PERSONAL FACTORS: Age, Time since onset of injury/illness/exacerbation, and 1-2 comorbidities: Lt hip OA, chronic gait abnormality are also affecting patient's functional outcome.   REHAB POTENTIAL: Good  CLINICAL DECISION MAKING: Evolving/moderate complexity  EVALUATION COMPLEXITY: Moderate   GOALS: Goals reviewed with patient? Yes  SHORT TERM GOALS: Target date:  05/25/2024      Be independent in initial HEP Baseline: Goal status: MET  2.  Ambulate 400 feet with 3 min walk test Baseline: 390 feet with RPE 8-9/10 (04/27/24) Goal status: NEW  3.  Report > or = to 60% reduction in LBP/thoracic pain with ADLs and self-care Baseline: 50% reduction in frequency (05/04/24) Goal status: In progress  4.  Improve LE strength to perform stand to sit with controlled descent x 5 reps  Baseline: able to do this with controlled descent, on toe of Rt foot (04/06/24 Goal status: MET  5.  Report postural corrections at  home to reduce lumbar/thoracic stress with standing Baseline: has been using walker to assist, still very challenged with this (05/04/24) Goal status: In progress    LONG TERM GOALS: Target date: 06/29/2024      Be independent in advanced HEP Baseline:  Goal status: in progress   2.  Ambulate 425 feet with 3 min walk test Baseline:310 feet Goal status: INITIAL  3.  Report > or = to 70% reduction in LBP/thoracic pain with ADLs and self-care Baseline: 50% reduced frequency, no change in intensity (05/04/24) Goal status: In progress  4.  Perform 5x sit to stand in < or = to 15 seconds with min UE support to reduce falls risk Baseline: 20 seconds  (05/04/24) Goal status: IN PROGRESS  5.  Improve endurance to perform 3 min walk test with RPE < or = to 5/10 Baseline: 8-9/10 Goal status: INITIAL   PLAN:  PT FREQUENCY: 2x/week  PT DURATION: 8 weeks  PLANNED INTERVENTIONS: 97110-Therapeutic exercises, 97530- Therapeutic activity, 97112- Neuromuscular re-education, 97535- Self Care, 02859- Manual therapy, [phone]- Gait training, [phone]- Canalith repositioning, [id number]- Aquatic Therapy, [phone]- Electrical stimulation (unattended), [phone]- Electrical stimulation (manual), Patient/Family education, Stair training, Taping, Dry Needling, Joint mobilization, Spinal manipulation, Spinal mobilization, Vestibular training, Cryotherapy, and Moist heat.  PLAN FOR NEXT SESSION: DN as indicated, work on posture, sit to stand, lumbar and hip flexibility.     Burnard Joy, PT 05/23/24 3:29 PM  [facility] [address] [place], [place] [address] Phone # [phone] Fax [phone]

## 2024-05-26 ENCOUNTER — Ambulatory Visit

## 2024-05-26 DIAGNOSIS — M546 Pain in thoracic spine: Secondary | ICD-10-CM

## 2024-05-26 DIAGNOSIS — M5459 Other low back pain: Secondary | ICD-10-CM | POA: Diagnosis not present

## 2024-05-26 DIAGNOSIS — M6281 Muscle weakness (generalized): Secondary | ICD-10-CM | POA: Diagnosis not present

## 2024-05-26 DIAGNOSIS — R2689 Other abnormalities of gait and mobility: Secondary | ICD-10-CM

## 2024-05-26 DIAGNOSIS — R252 Cramp and spasm: Secondary | ICD-10-CM

## 2024-05-26 NOTE — Therapy (Signed)
 OUTPATIENT PHYSICAL THERAPY TREATMENT   Patient Name: Kerry Wright MRN: [id number] DOB:[birth year], 83 y.o., female Today's Date: 05/26/2024    END OF SESSION:  PT End of Session - 05/26/24 1031     Visit Number 19    Date for PT Re-Evaluation 06/29/24    Authorization Type [employer] Medicare    Progress Note Due on Visit 20    PT Start Time 0932    PT Stop Time 1015    PT Time Calculation (min) 43 min    Activity Tolerance Patient tolerated treatment well    Behavior During Therapy WFL for tasks assessed/performed                           Past Medical History:  Diagnosis Date   Complication of anesthesia    Headache(784.0)    hx. migraines - less occurring now.   History of kidney stones    remains with kidney stones(not a problem at this time)   Hypertension    Hyperthyroidism    hx. Graves disease 'tx.with radioactive iodine   Osteoarthritis    PONV (postoperative nausea and vomiting)    Past Surgical History:  Procedure Laterality Date   COLONOSCOPY WITH PROPOFOL  N/A 07/31/2014   Procedure: COLONOSCOPY WITH PROPOFOL ;  Surgeon: Gladis MARLA Louder, MD;  Location: [facility];  Service: Endoscopy;  Laterality: N/A;   TONSILLECTOMY     child   There are no active problems to display for this patient.   REFERRING PROVIDER/PCP: Dwight Ave, MD   REFERRING DIAG: M54.9 (ICD-10-CM) - Back pain, Rt sided thoracic pain with muscle spasms  Rationale for Evaluation and Treatment: Rehabilitation  THERAPY DIAG:  Pain in thoracic spine  Other low back pain  Other abnormalities of gait and mobility  Muscle weakness (generalized)  Cramp and spasm  ONSET DATE: 4 month history   SUBJECTIVE:                                                                                                                                                                                           SUBJECTIVE STATEMENT:  I had to take a muscle relaxer yesterday and it was  the first time in 4 days.  I am able to manage them on my own much better.     From Evaluation: Pt present to PT with Rt>Lt thoracic pain and spasms that began 10/2023. MD has prescribed muscle relaxers and is not sure if it is helping. She walks with a quad cane with significant trunk flexion.     PERTINENT HISTORY:  HTN, Rt hip OA (  needs THA), migraines   PAIN: 05/26/24 Are you having pain? Yes: NPRS scale: 0/10 Pain location: bil thoracic and low back  Pain description: shooting, aching  Aggravating factors: early morning, bending over to reach, sometimes just sitting still , random  Relieving factors: pain patch, Aleve, Excedrin  PRECAUTIONS: Fall  RED FLAGS: None   WEIGHT BEARING RESTRICTIONS: No  FALLS:  Has patient fallen in last 6 months? No  LIVING ENVIRONMENT: Lives with: lives alone Lives in: House/apartment Stairs: Yes: Internal: 12 steps; on right going uplives on main level, doesn't need to go up steps  Has following equipment at home: Chiropractor, Environmental consultant - 4 wheeled, and shower chair  OCCUPATION: retired   PLOF: Independent with community mobility without device and Leisure: watches TV, reading, crossword puzzles, uses hand weights for exercise  PATIENT GOALS: reduce back pain, improve stability   NEXT MD VISIT: May 2025  OBJECTIVE:  Note: Objective measures were completed at Evaluation unless otherwise noted.  DIAGNOSTIC FINDINGS:  CT 03/28/24 IMPRESSION: 1. Thoracic spondylosis and degenerative disc disease, with bridging anterior interbody spurring all levels between T7 and T12, and non bridging anterior interbody spurring at T5-6. 2. Mild thoracic kyphosis and dextroconvex thoracic scoliosis. 3.  Aortic Atherosclerosis (ICD10-I70.0).  Coronary atherosclerosis. 4. Suspected cholelithiasis. 5. Prominence of stool throughout the colon compatible with constipation.  PATIENT SURVEYS:  03/07/24: Modified Oswestry 18/45 =40% disability  04/22/24    Modified Oswestry 9 / 50 = 18.0 %  COGNITION: Overall cognitive status: Within functional limits for tasks assessed     SENSATION: WFL   POSTURE: rounded shoulders, forward head, flexed trunk , and weight shift left  PALPATION: Palpable tenderness over Lt>Rt thoracic paraspinals, Lt rhomboids and lumbar paraspinals   LOWER EXTREMITY MMT:    Rt hip rests in ER  4/5, Lt hip 4+/5, bil knees 4+/5   FUNCTIONAL TESTS:  04/06/24: 5x sit to stand: 20.3 seconds with controlled descent  3 min walk test: 255 feet with 7/10 RPE  03/08/24: 5 times sit to stand: 20.02 seconds with uncontrolled descent 03/16/24: 3 min walk test 264 feet with 8/10 RPE  04/22/24 3 min walk test 390 ft with 8-9/10 RPE  5XSTS 19.16 sec with controlled descent  05/04/24: 5x sit to stand 20 seconds   GAIT: Distance walked: 50 Assistive device utilized: Quad cane large base Level of assistance: Modified independence Comments: ER at Rt hip, flexed trunk  TREATMENT DATE:    05/26/24: NuStep: level 5x 10 minutes- PT present to discuss progress Sit to stand 2x5  Seated trunk ext and diagonals x 10 ea 5# kettlebell and following with eyes Seated hip to hip, V's (shoulder/waist/shoulder), ear to ear 5# kettlebell: x20 [employer] engines 3 # weights 2x10 Biceps curl to OH press 3# x10 reps  Seated lumbar ext with magenta powercord x 10 Long arc quads x 30, marching and ER with 3# added to ankles 2x10 Seated triceps dips on yoga blocks x 20 Gait up and down hallway -no seated rest break taken today Press into ball on leg: diagonal with 5 hold x15 each   05/23/24: NuStep: level 5x 10 minutes- PT present to discuss progress Sit to stand 2x5  Seated trunk ext and diagonals x 10 ea 5# kettlebell and following with eyes Seated hip to hip, V's (shoulder/waist/shoulder), ear to ear 5# kettlebell: x20 [employer] engines 3 # weights 2x10 Biceps curl to [employer] press 3# x10 reps  Seated lumbar ext with magenta powercord x 10  Long arc  quads x 30, marching and ER with 2.5# added to ankles 2x10 Seated triceps dips on yoga blocks x 20 Gait up and down hallway -no seated rest break taken today Press into ball on leg: diagonal with 5 hold x15 each   05/17/24: NuStep: level 5x 10 minutes- PT present to discuss progress Sit to stand 2x5  Seated trunk ext and diagonals x 10 ea 5# kettlebell and following with eyes Seated hip to hip, V's (shoulder/waist/shoulder), ear to ear 5# kettlebell: x20 Steam engines 3 # weights 2x10 Biceps curl to OH press 3# x10 reps  Seated lumbar ext with magenta powercord x 10 Long arc quads x 30, marching and ER with 2.5# added to ankles 2x10 Seated triceps dips on yoga blocks x 20   PATIENT EDUCATION:  Education details: [email or web address], HEP update Person educated: Patient Education method: Explanation, Demonstration, and Handouts Education comprehension: verbalized understanding and returned demonstration  HOME EXERCISE PROGRAM: Access Code: [email or web address] URL: [email or web address] Date: 03/23/2024 Prepared by: Burnard  Exercises - Supine Hip Internal and External Rotation  - 1 x daily - 7 x weekly - 3 sets - 10 reps - Seated Scapular Retraction  - 5 x daily - 7 x weekly - 1 sets - 10 reps - 5 hold - Seated Thoracic Flexion and Rotation with Arms Crossed  - 3 x daily - 7 x weekly - 1 sets - 3 reps - 10 hold - Seated Correct Posture  - 1 x daily - 7 x weekly - 3 sets - 10 reps - Correct Standing Posture  - 1 x daily - 7 x weekly - 3 sets - 10 reps - Seated Hamstring Stretch  - 3 x daily - 7 x weekly - 1 sets - 3 reps - 20 hold - Supine Lower Trunk Rotation  - 3 x daily - 7 x weekly - 1 sets - 3 reps - 20 hold - Seated Thoracic Extension Arms Overhead  - 1 x daily - 7 x weekly - 1-2 sets - 10 reps - 2-3 sec hold - Seated Trunk Rotation  - 1 x daily - 7 x weekly - 1-2 sets - 10 reps - 2-3 sec hold - Seated Diagonal Chops with Medicine Ball  - 1 x daily - 7 x weekly - 1-2 sets - 10 reps -  2-3 hold - Seated Bilateral Shoulder External Rotation with Resistance  - 1 x daily - 7 x weekly - 2 sets - 10 reps - Seated Shoulder Horizontal Abduction with Resistance  - 1 x daily - 7 x weekly - 2 sets - 10 reps ASSESSMENT:  CLINICAL IMPRESSION: Pt had muscle spasms yesterday and had to take muscle relaxer. She is taking this much less frequently that at the start of care.    Improved functional mobility this week and able to perform sit to stand without UE support and walk down the hall and back and not take a rest break. PT monitored throughout session for fatigue, pain and safety.  No pain with treatment today.  Patient will benefit from skilled PT to address the below impairments and improve overall function.     OBJECTIVE IMPAIRMENTS: Abnormal gait, decreased activity tolerance, decreased balance, decreased mobility, difficulty walking, decreased strength, decreased safety awareness, hypomobility, increased muscle spasms, impaired flexibility, improper body mechanics, postural dysfunction, and pain.   ACTIVITY LIMITATIONS: carrying, lifting, standing, squatting, stairs, transfers, and locomotion level  PARTICIPATION LIMITATIONS: meal prep, cleaning, laundry, driving, community activity, and church  PERSONAL FACTORS: Age, Time since onset of injury/illness/exacerbation, and 1-2 comorbidities: Lt hip OA, chronic gait abnormality are also affecting patient's functional outcome.   REHAB POTENTIAL: Good  CLINICAL DECISION MAKING: Evolving/moderate complexity  EVALUATION COMPLEXITY: Moderate   GOALS: Goals reviewed with patient? Yes  SHORT TERM GOALS: Target date: 05/25/2024      Be independent in initial HEP Baseline: Goal status: MET  2.  Ambulate 400 feet with 3 min walk test Baseline: 390 feet with RPE 8-9/10 (04/27/24) Goal status: NEW  3.  Report > or = to 60% reduction in LBP/thoracic pain with ADLs and self-care Baseline: 50% reduction in frequency (05/04/24) Goal  status: In progress  4.  Improve LE strength to perform stand to sit with controlled descent x 5 reps  Baseline: able to do this with controlled descent, on toe of Rt foot (04/06/24 Goal status: MET  5.  Report postural corrections at home to reduce lumbar/thoracic stress with standing Baseline: has been using walker to assist, still very challenged with this (05/04/24) Goal status: In progress    LONG TERM GOALS: Target date: 06/29/2024      Be independent in advanced HEP Baseline:  Goal status: in progress   2.  Ambulate 425 feet with 3 min walk test Baseline:310 feet Goal status: INITIAL  3.  Report > or = to 70% reduction in LBP/thoracic pain with ADLs and self-care Baseline: 50% reduced frequency, no change in intensity (05/04/24) Goal status: In progress  4.  Perform 5x sit to stand in < or = to 15 seconds with min UE support to reduce falls risk Baseline: 20 seconds  (05/04/24) Goal status: IN PROGRESS  5.  Improve endurance to perform 3 min walk test with RPE < or = to 5/10 Baseline: 8-9/10 Goal status: INITIAL   PLAN:  PT FREQUENCY: 2x/week  PT DURATION: 8 weeks  PLANNED INTERVENTIONS: 97110-Therapeutic exercises, 97530- Therapeutic activity, 97112- Neuromuscular re-education, 97535- Self Care, 02859- Manual therapy, [phone]- Gait training, [phone]- Canalith repositioning, [id number]- Aquatic Therapy, [phone]- Electrical stimulation (unattended), [phone]- Electrical stimulation (manual), Patient/Family education, Stair training, Taping, Dry Needling, Joint mobilization, Spinal manipulation, Spinal mobilization, Vestibular training, Cryotherapy, and Moist heat.  PLAN FOR NEXT SESSION: endurance, work on posture, sit to stand, lumbar and hip flexibility.  20th visit next:    Burnard Joy, PT 05/26/24 10:34 AM  [facility] [address], [address] [place], [place] [address] Phone # [phone] Fax [phone]

## 2024-05-31 DIAGNOSIS — N200 Calculus of kidney: Secondary | ICD-10-CM | POA: Diagnosis not present

## 2024-06-01 ENCOUNTER — Ambulatory Visit: Admitting: Physical Therapy

## 2024-06-01 ENCOUNTER — Encounter: Payer: Self-pay | Admitting: Physical Therapy

## 2024-06-01 DIAGNOSIS — M546 Pain in thoracic spine: Secondary | ICD-10-CM

## 2024-06-01 DIAGNOSIS — M6281 Muscle weakness (generalized): Secondary | ICD-10-CM | POA: Diagnosis not present

## 2024-06-01 DIAGNOSIS — R2689 Other abnormalities of gait and mobility: Secondary | ICD-10-CM | POA: Diagnosis not present

## 2024-06-01 DIAGNOSIS — M5459 Other low back pain: Secondary | ICD-10-CM

## 2024-06-01 DIAGNOSIS — R252 Cramp and spasm: Secondary | ICD-10-CM

## 2024-06-01 NOTE — Therapy (Signed)
 OUTPATIENT PHYSICAL THERAPY TREATMENT AND 20TH VISIT PROGRESS NOTE  Reporting Period 04/22/24 to 06/01/24   See note below for Objective Data and Assessment of Progress/Goals.    Patient Name: Kerry Wright MRN: [id number] DOB:February 24, 1941, 83 y.o., female Today's Date: 06/01/2024    END OF SESSION:  PT End of Session - 06/01/24 1012     Visit Number 20    Date for PT Re-Evaluation 06/29/24    Authorization Type [employer]    Progress Note Due on Visit 30    PT Start Time 1012    PT Stop Time 1100    PT Time Calculation (min) 48 min    Activity Tolerance Patient tolerated treatment well    Behavior During Therapy WFL for tasks assessed/performed                            Past Medical History:  Diagnosis Date   Complication of anesthesia    Headache(784.0)    hx. migraines - less occurring now.   History of kidney stones    remains with kidney stones(not a problem at this time)   Hypertension    Hyperthyroidism    hx. Graves disease 'tx.with radioactive iodine   Osteoarthritis    PONV (postoperative nausea and vomiting)    Past Surgical History:  Procedure Laterality Date   COLONOSCOPY WITH PROPOFOL  N/A 07/31/2014   Procedure: COLONOSCOPY WITH PROPOFOL ;  Surgeon: Gladis MARLA Louder, MD;  Location: [facility];  Service: Endoscopy;  Laterality: N/A;   TONSILLECTOMY     child   There are no active problems to display for this patient.   REFERRING PROVIDER/PCP: Dwight Ave, MD   REFERRING DIAG: M54.9 (ICD-10-CM) - Back pain, Rt sided thoracic pain with muscle spasms  Rationale for Evaluation and Treatment: Rehabilitation  THERAPY DIAG:  Pain in thoracic spine  Other low back pain  Other abnormalities of gait and mobility  Muscle weakness (generalized)  Cramp and spasm  ONSET DATE: 4 month history   SUBJECTIVE:                                                                                                                                                                                            SUBJECTIVE STATEMENT:  Feeling tired today.      From Evaluation: Pt present to PT with Rt>Lt thoracic pain and spasms that began 10/2023. MD has prescribed muscle relaxers and is not sure if it is helping. She walks with a quad cane with significant trunk flexion.     PERTINENT HISTORY:  HTN, Rt hip OA (  needs THA), migraines   PAIN: 05/26/24 Are you having pain? Yes: NPRS scale: 0/10 Pain location: bil thoracic and low back  Pain description: shooting, aching  Aggravating factors: early morning, bending over to reach, sometimes just sitting still , random  Relieving factors: pain patch, Aleve, Excedrin  PRECAUTIONS: Fall  RED FLAGS: None   WEIGHT BEARING RESTRICTIONS: No  FALLS:  Has patient fallen in last 6 months? No  LIVING ENVIRONMENT: Lives with: lives alone Lives in: House/apartment Stairs: Yes: Internal: 12 steps; on right going uplives on main level, doesn't need to go up steps  Has following equipment at home: Chiropractor, Environmental consultant - 4 wheeled, and shower chair  OCCUPATION: retired   PLOF: Independent with community mobility without device and Leisure: watches TV, reading, crossword puzzles, uses hand weights for exercise  PATIENT GOALS: reduce back pain, improve stability   NEXT MD VISIT: May 2025  OBJECTIVE:  Note: Objective measures were completed at Evaluation unless otherwise noted.  DIAGNOSTIC FINDINGS:  CT 03/28/24 IMPRESSION: 1. Thoracic spondylosis and degenerative disc disease, with bridging anterior interbody spurring all levels between T7 and T12, and non bridging anterior interbody spurring at T5-6. 2. Mild thoracic kyphosis and dextroconvex thoracic scoliosis. 3.  Aortic Atherosclerosis (ICD10-I70.0).  Coronary atherosclerosis. 4. Suspected cholelithiasis. 5. Prominence of stool throughout the colon compatible with constipation.  PATIENT SURVEYS:  03/07/24: Modified  Oswestry 18/45 =40% disability  04/22/24   Modified Oswestry 9 / 50 = 18.0 %  COGNITION: Overall cognitive status: Within functional limits for tasks assessed     SENSATION: WFL   POSTURE: rounded shoulders, forward head, flexed trunk , and weight shift left  PALPATION: Palpable tenderness over Lt>Rt thoracic paraspinals, Lt rhomboids and lumbar paraspinals   LOWER EXTREMITY MMT:    Rt hip rests in ER  4/5, Lt hip 4+/5, bil knees 4+/5   FUNCTIONAL TESTS:  04/06/24: 5x sit to stand: 20.3 seconds with controlled descent  3 min walk test: 255 feet with 7/10 RPE  03/08/24: 5 times sit to stand: 20.02 seconds with uncontrolled descent 03/16/24: 3 min walk test 264 feet with 8/10 RPE  04/22/24 3 min walk test 390 ft with 8-9/10 RPE  5XSTS 19.16 sec with controlled descent  05/04/24: 5x sit to stand 20 seconds   06/01/24 3 min walk test 357 ft with 7/10 RPE  5XSTS 15.85 sec  with controlled descent  GAIT: Distance walked: 50 Assistive device utilized: Quad cane large base Level of assistance: Modified independence Comments: ER at Rt hip, flexed trunk  TREATMENT DATE:  06/01/24: 3 MWT 5XSTS Goals assessed Sit to stand 2x5  Seated trunk ext and diagonals x 10 ea 5# kettlebell and following with eyes Seated hip to hip, V's (shoulder/waist/shoulder), ear to ear 5# kettlebell: x20 Steam engines 3 # weights 2x10 Biceps curl to OH press 3# 2 x10 reps  Seated abdominal crunch with 5# KB x 10 (holding at chest) Seated lumbar ext with magenta powercord x 10 Long arc quads 3# x 30, marching and ER with 3# added to ankles 2x10 NuStep: level 5x 10 minutes- PT present to discuss progress     05/26/24: NuStep: level 5x 10 minutes- PT present to discuss progress Sit to stand 2x5  Seated trunk ext and diagonals x 10 ea 5# kettlebell and following with eyes Seated hip to hip, V's (shoulder/waist/shoulder), ear to ear 5# kettlebell: x20 Steam engines 3 # weights 2x10 Biceps curl to [facility]  press 3# x10 reps  Seated lumbar ext with magenta powercord x 10 Long arc quads x 30, marching and ER with 3# added to ankles 2x10 Seated triceps dips on yoga blocks x 20 Gait up and down hallway -no seated rest break taken today Press into ball on leg: diagonal with 5 hold x15 each   05/23/24: NuStep: level 5x 10 minutes- PT present to discuss progress Sit to stand 2x5  Seated trunk ext and diagonals x 10 ea 5# kettlebell and following with eyes Seated hip to hip, V's (shoulder/waist/shoulder), ear to ear 5# kettlebell: x20 Steam engines 3 # weights 2x10 Biceps curl to OH press 3# x10 reps  Seated lumbar ext with magenta powercord x 10 Long arc quads x 30, marching and ER with 2.5# added to ankles 2x10 Seated triceps dips on yoga blocks x 20 Gait up and down hallway -no seated rest break taken today Press into ball on leg: diagonal with 5 hold x15 each   05/17/24: NuStep: level 5x 10 minutes- PT present to discuss progress Sit to stand 2x5  Seated trunk ext and diagonals x 10 ea 5# kettlebell and following with eyes Seated hip to hip, V's (shoulder/waist/shoulder), ear to ear 5# kettlebell: x20 Steam engines 3 # weights 2x10 Biceps curl to OH press 3# x10 reps  Seated lumbar ext with magenta powercord x 10 Long arc quads x 30, marching and ER with 2.5# added to ankles 2x10 Seated triceps dips on yoga blocks x 20   PATIENT EDUCATION:  Education details: [email or web address], HEP update Person educated: Patient Education method: Explanation, Demonstration, and Handouts Education comprehension: verbalized understanding and returned demonstration  HOME EXERCISE PROGRAM: Access Code: [email or web address] URL: [email or web address] Date: 03/23/2024 Prepared by: Burnard  Exercises - Supine Hip Internal and External Rotation  - 1 x daily - 7 x weekly - 3 sets - 10 reps - Seated Scapular Retraction  - 5 x daily - 7 x weekly - 1 sets - 10 reps - 5 hold - Seated Thoracic Flexion and Rotation  with Arms Crossed  - 3 x daily - 7 x weekly - 1 sets - 3 reps - 10 hold - Seated Correct Posture  - 1 x daily - 7 x weekly - 3 sets - 10 reps - Correct Standing Posture  - 1 x daily - 7 x weekly - 3 sets - 10 reps - Seated Hamstring Stretch  - 3 x daily - 7 x weekly - 1 sets - 3 reps - 20 hold - Supine Lower Trunk Rotation  - 3 x daily - 7 x weekly - 1 sets - 3 reps - 20 hold - Seated Thoracic Extension Arms Overhead  - 1 x daily - 7 x weekly - 1-2 sets - 10 reps - 2-3 sec hold - Seated Trunk Rotation  - 1 x daily - 7 x weekly - 1-2 sets - 10 reps - 2-3 sec hold - Seated Diagonal Chops with Medicine Ball  - 1 x daily - 7 x weekly - 1-2 sets - 10 reps - 2-3 hold - Seated Bilateral Shoulder External Rotation with Resistance  - 1 x daily - 7 x weekly - 2 sets - 10 reps - Seated Shoulder Horizontal Abduction with Resistance  - 1 x daily - 7 x weekly - 2 sets - 10 reps ASSESSMENT:  CLINICAL IMPRESSION: Patient has met her pain goal. She reports 80% improvement in pain and in the past week has only taken two muscle relaxors where she was taking 2 per  day. She has decreased her time with 5xSTS showing functional improvement in strength. She was able to walk 357 ft today with the which is a shorter difference than previously, but better than evaluation. She stated when she came in that she was tired today and didn't have much energy, so this may have affected her tolerance today. She continues to demonstrate potential for improvement and would benefit from continued skilled therapy to address impairments.      OBJECTIVE IMPAIRMENTS: Abnormal gait, decreased activity tolerance, decreased balance, decreased mobility, difficulty walking, decreased strength, decreased safety awareness, hypomobility, increased muscle spasms, impaired flexibility, improper body mechanics, postural dysfunction, and pain.   ACTIVITY LIMITATIONS: carrying, lifting, standing, squatting, stairs, transfers, and locomotion  level  PARTICIPATION LIMITATIONS: meal prep, cleaning, laundry, driving, community activity, and church  PERSONAL FACTORS: Age, Time since onset of injury/illness/exacerbation, and 1-2 comorbidities: Lt hip OA, chronic gait abnormality are also affecting patient's functional outcome.   REHAB POTENTIAL: Good  CLINICAL DECISION MAKING: Evolving/moderate complexity  EVALUATION COMPLEXITY: Moderate   GOALS: Goals reviewed with patient? Yes  SHORT TERM GOALS: Target date: 05/25/2024      Be independent in initial HEP Baseline: Goal status: MET  2.  Ambulate 400 feet with 3 min walk test Baseline: 390 feet with RPE 8-9/10 (04/27/24) Goal status: NEW  3.  Report > or = to 60% reduction in LBP/thoracic pain with ADLs and self-care Baseline: 80% reduction in frequency (06/01/24) Goal status: MET  4.  Improve LE strength to perform stand to sit with controlled descent x 5 reps  Baseline: able to do this with controlled descent, on toe of Rt foot (04/06/24 Goal status: MET  5.  Report postural corrections at home to reduce lumbar/thoracic stress with standing Baseline: has been using walker to assist, still very challenged with this (05/04/24) Goal status: In progress    LONG TERM GOALS: Target date: 06/29/2024      Be independent in advanced HEP Baseline:  Goal status: in progress   2.  Ambulate 425 feet with 3 min walk test Baseline:310 feet Goal status: IN PROGRESS 06/01/24 357 ft   3.  Report > or = to 70% reduction in LBP/thoracic pain with ADLs and self-care Baseline: 80% reduction in frequency (06/01/24) Goal status: MET  4.  Perform 5x sit to stand in < or = to 15 seconds with min UE support to reduce falls risk Baseline: 15.85 seconds  (06/01/24) Goal status: IN PROGRESS  5.  Improve endurance to perform 3 min walk test with RPE < or = to 5/10 Baseline: 8-9/10 Goal status: IN PROGRESS 06/01/24 7/10 RPE   PLAN:  PT FREQUENCY: 2x/week  PT DURATION: 8  weeks  PLANNED INTERVENTIONS: 97110-Therapeutic exercises, 97530- Therapeutic activity, 97112- Neuromuscular re-education, 97535- Self Care, [id number]- Manual therapy, [phone]- Gait training, [phone]- Canalith repositioning, [id number]- Aquatic Therapy, [phone]- Electrical stimulation (unattended), [phone]- Electrical stimulation (manual), Patient/Family education, Stair training, Taping, Dry Needling, Joint mobilization, Spinal manipulation, Spinal mobilization, Vestibular training, Cryotherapy, and Moist heat.  PLAN FOR NEXT SESSION: endurance, work on posture, sit to stand, lumbar and hip flexibility.  20th visit next:    Mliss Cummins, PT  06/01/24 11:00 AM  [facility] [address] [place], [place] [address] Phone # [phone] Fax [phone]

## 2024-06-02 NOTE — Therapy (Signed)
 OUTPATIENT PHYSICAL THERAPY TREATMENT  Patient Name: Kerry Wright MRN: [id number] DOB:Apr 13, 1941, 83 y.o., female Today's Date: 06/03/2024    END OF SESSION:  PT End of Session - 06/03/24 1006     Visit Number 21    Date for PT Re-Evaluation 06/29/24    Authorization Type [employer] Medicare    Progress Note Due on Visit 30    PT Start Time 1007    PT Stop Time 1048    PT Time Calculation (min) 41 min    Activity Tolerance Patient tolerated treatment well    Behavior During Therapy WFL for tasks assessed/performed                Past Medical History:  Diagnosis Date   Complication of anesthesia    Headache(784.0)    hx. migraines - less occurring now.   History of kidney stones    remains with kidney stones(not a problem at this time)   Hypertension    Hyperthyroidism    hx. Graves disease 'tx.with radioactive iodine   Osteoarthritis    PONV (postoperative nausea and vomiting)    Past Surgical History:  Procedure Laterality Date   COLONOSCOPY WITH PROPOFOL  N/A 07/31/2014   Procedure: COLONOSCOPY WITH PROPOFOL ;  Surgeon: Gladis MARLA Louder, MD;  Location: [facility];  Service: Endoscopy;  Laterality: N/A;   TONSILLECTOMY     child   There are no active problems to display for this patient.   REFERRING PROVIDER/PCP: Dwight Ave, MD   REFERRING DIAG: M54.9 (ICD-10-CM) - Back pain, Rt sided thoracic pain with muscle spasms  Rationale for Evaluation and Treatment: Rehabilitation  THERAPY DIAG:  Pain in thoracic spine  Other low back pain  Other abnormalities of gait and mobility  Muscle weakness (generalized)  Cramp and spasm  ONSET DATE: 4 month history   SUBJECTIVE:                                                                                                                                                                                           SUBJECTIVE STATEMENT:  I'm okay today. Genna is a good day.     From Evaluation: Pt present to PT  with Rt>Lt thoracic pain and spasms that began 10/2023. MD has prescribed muscle relaxers and is not sure if it is helping. She walks with a quad cane with significant trunk flexion.     PERTINENT HISTORY:  HTN, Rt hip OA (needs THA), migraines   PAIN: 05/26/24 Are you having pain? Yes: NPRS scale: 0/10 Pain location: bil thoracic and low back  Pain description: shooting, aching  Aggravating factors: early morning, bending  over to reach, sometimes just sitting still , random  Relieving factors: pain patch, Aleve, Excedrin  PRECAUTIONS: Fall  RED FLAGS: None   WEIGHT BEARING RESTRICTIONS: No  FALLS:  Has patient fallen in last 6 months? No  LIVING ENVIRONMENT: Lives with: lives alone Lives in: House/apartment Stairs: Yes: Internal: 12 steps; on right going uplives on main level, doesn't need to go up steps  Has following equipment at home: Chiropractor, Environmental consultant - 4 wheeled, and shower chair  OCCUPATION: retired   PLOF: Independent with community mobility without device and Leisure: watches TV, reading, crossword puzzles, uses hand weights for exercise  PATIENT GOALS: reduce back pain, improve stability   NEXT MD VISIT: May 2025  OBJECTIVE:  Note: Objective measures were completed at Evaluation unless otherwise noted.  DIAGNOSTIC FINDINGS:  CT 03/28/24 IMPRESSION: 1. Thoracic spondylosis and degenerative disc disease, with bridging anterior interbody spurring all levels between T7 and T12, and non bridging anterior interbody spurring at T5-6. 2. Mild thoracic kyphosis and dextroconvex thoracic scoliosis. 3.  Aortic Atherosclerosis (ICD10-I70.0).  Coronary atherosclerosis. 4. Suspected cholelithiasis. 5. Prominence of stool throughout the colon compatible with constipation.  PATIENT SURVEYS:  03/07/24: Modified Oswestry 18/45 =40% disability  04/22/24   Modified Oswestry 9 / 50 = 18.0 %  COGNITION: Overall cognitive status: Within functional limits for tasks  assessed     SENSATION: WFL   POSTURE: rounded shoulders, forward head, flexed trunk , and weight shift left  PALPATION: Palpable tenderness over Lt>Rt thoracic paraspinals, Lt rhomboids and lumbar paraspinals   LOWER EXTREMITY MMT:    Rt hip rests in ER  4/5, Lt hip 4+/5, bil knees 4+/5   FUNCTIONAL TESTS:  04/06/24: 5x sit to stand: 20.3 seconds with controlled descent  3 min walk test: 255 feet with 7/10 RPE  03/08/24: 5 times sit to stand: 20.02 seconds with uncontrolled descent 03/16/24: 3 min walk test 264 feet with 8/10 RPE  04/22/24 3 min walk test 390 ft with 8-9/10 RPE  5XSTS 19.16 sec with controlled descent  05/04/24: 5x sit to stand 20 seconds   06/01/24 3 min walk test 357 ft with 7/10 RPE  5XSTS 15.85 sec  with controlled descent  GAIT: Distance walked: 50 Assistive device utilized: Quad cane large base Level of assistance: Modified independence Comments: ER at Rt hip, flexed trunk  TREATMENT DATE:   06/03/24: NuStep: level 5x 10 minutes- PT present to discuss progress Sit to stand 1x 10, then Sit to stand with slow descent x 10 Seated trunk ext and diagonals x 10 ea 5# kettlebell and following with eyes Seated hip to hip, V's (shoulder/waist/shoulder), ear to ear 5# kettlebell: x10 Steam engines 3 # weights 2x10 Biceps curl 4# x 20,  Seated horizontal ABD 2# x 10 Seated lateral raises 1# x 10 - easy then did 2# OH press 3# x20 reps  Seated lumbar ext with magenta powercord x 10 Long arc quads x 20, marching and ER with 4# added to ankles 2x10   06/01/24: 3 MWT 5XSTS Goals assessed Sit to stand 2x5  Seated trunk ext and diagonals x 10 ea 5# kettlebell and following with eyes Seated hip to hip, V's (shoulder/waist/shoulder), ear to ear 5# kettlebell: x20 Steam engines 3 # weights 2x10 Biceps curl to OH press 3# 2 x10 reps  Seated abdominal crunch with 5# KB x 10 (holding at chest) Seated lumbar ext with magenta powercord x 10 Long arc quads 3# x  30, marching and  ER with 3# added to ankles 2x10 NuStep: level 5x 10 minutes- PT present to discuss progress  05/26/24: NuStep: level 5x 10 minutes- PT present to discuss progress Sit to stand 2x5  Seated trunk ext and diagonals x 10 ea 5# kettlebell and following with eyes Seated hip to hip, V's (shoulder/waist/shoulder), ear to ear 5# kettlebell: x20 Steam engines 3 # weights 2x10 Biceps curl to OH press 3# x10 reps  Seated lumbar ext with magenta powercord x 10 Long arc quads x 30, marching and ER with 3# added to ankles 2x10 Seated triceps dips on yoga blocks x 20 Gait up and down hallway -no seated rest break taken today Press into ball on leg: diagonal with 5 hold x15 each  PATIENT EDUCATION:  Education details: [email or web address], HEP update Person educated: Patient Education method: Explanation, Demonstration, and Handouts Education comprehension: verbalized understanding and returned demonstration  HOME EXERCISE PROGRAM: Access Code: [email or web address] URL: [email or web address] Date: 03/23/2024 Prepared by: Burnard  Exercises - Supine Hip Internal and External Rotation  - 1 x daily - 7 x weekly - 3 sets - 10 reps - Seated Scapular Retraction  - 5 x daily - 7 x weekly - 1 sets - 10 reps - 5 hold - Seated Thoracic Flexion and Rotation with Arms Crossed  - 3 x daily - 7 x weekly - 1 sets - 3 reps - 10 hold - Seated Correct Posture  - 1 x daily - 7 x weekly - 3 sets - 10 reps - Correct Standing Posture  - 1 x daily - 7 x weekly - 3 sets - 10 reps - Seated Hamstring Stretch  - 3 x daily - 7 x weekly - 1 sets - 3 reps - 20 hold - Supine Lower Trunk Rotation  - 3 x daily - 7 x weekly - 1 sets - 3 reps - 20 hold - Seated Thoracic Extension Arms Overhead  - 1 x daily - 7 x weekly - 1-2 sets - 10 reps - 2-3 sec hold - Seated Trunk Rotation  - 1 x daily - 7 x weekly - 1-2 sets - 10 reps - 2-3 sec hold - Seated Diagonal Chops with Medicine Ball  - 1 x daily - 7 x weekly - 1-2 sets - 10  reps - 2-3 hold - Seated Bilateral Shoulder External Rotation with Resistance  - 1 x daily - 7 x weekly - 2 sets - 10 reps - Seated Shoulder Horizontal Abduction with Resistance  - 1 x daily - 7 x weekly - 2 sets - 10 reps ASSESSMENT:  CLINICAL IMPRESSION: Patient was able to increase resistance today with Upper and Lower extremity exercises. Marching is difficult on the left with 4#, but pt able to do ER and LAQs. Patient reporting rotational exercises have helped her be able to turn and lock/unlock her door when entering her house. She used to turn and get the spasm in her back, but this no longer happens.       OBJECTIVE IMPAIRMENTS: Abnormal gait, decreased activity tolerance, decreased balance, decreased mobility, difficulty walking, decreased strength, decreased safety awareness, hypomobility, increased muscle spasms, impaired flexibility, improper body mechanics, postural dysfunction, and pain.   ACTIVITY LIMITATIONS: carrying, lifting, standing, squatting, stairs, transfers, and locomotion level  PARTICIPATION LIMITATIONS: meal prep, cleaning, laundry, driving, community activity, and church  PERSONAL FACTORS: Age, Time since onset of injury/illness/exacerbation, and 1-2 comorbidities: Lt hip OA, chronic gait abnormality are also affecting patient's functional outcome.   REHAB POTENTIAL: Good  CLINICAL DECISION MAKING: Evolving/moderate complexity  EVALUATION COMPLEXITY: Moderate   GOALS: Goals reviewed with patient? Yes  SHORT TERM GOALS: Target date: 05/25/2024      Be independent in initial HEP Baseline: Goal status: MET  2.  Ambulate 400 feet with 3 min walk test Baseline: 390 feet with RPE 8-9/10 (04/27/24) Goal status: NEW  3.  Report > or = to 60% reduction in LBP/thoracic pain with ADLs and self-care Baseline: 80% reduction in frequency (06/01/24) Goal status: MET  4.  Improve LE strength to perform stand to sit with controlled descent x 5 reps  Baseline: able  to do this with controlled descent, on toe of Rt foot (04/06/24 Goal status: MET  5.  Report postural corrections at home to reduce lumbar/thoracic stress with standing Baseline: has been using walker to assist, still very challenged with this (05/04/24) Goal status: In progress    LONG TERM GOALS: Target date: 06/29/2024      Be independent in advanced HEP Baseline:  Goal status: in progress   2.  Ambulate 425 feet with 3 min walk test Baseline:310 feet Goal status: IN PROGRESS 06/01/24 357 ft   3.  Report > or = to 70% reduction in LBP/thoracic pain with ADLs and self-care Baseline: 80% reduction in frequency (06/01/24) Goal status: MET  4.  Perform 5x sit to stand in < or = to 15 seconds with min UE support to reduce falls risk Baseline: 15.85 seconds  (06/01/24) Goal status: IN PROGRESS  5.  Improve endurance to perform 3 min walk test with RPE < or = to 5/10 Baseline: 8-9/10 Goal status: IN PROGRESS 06/01/24 7/10 RPE   PLAN:  PT FREQUENCY: 2x/week  PT DURATION: 8 weeks  PLANNED INTERVENTIONS: 97110-Therapeutic exercises, 97530- Therapeutic activity, 97112- Neuromuscular re-education, 97535- Self Care, 02859- Manual therapy, [phone]- Gait training, [phone]- Canalith repositioning, [id number]- Aquatic Therapy, [phone]- Electrical stimulation (unattended), [phone]- Electrical stimulation (manual), Patient/Family education, Stair training, Taping, Dry Needling, Joint mobilization, Spinal manipulation, Spinal mobilization, Vestibular training, Cryotherapy, and Moist heat.  PLAN FOR NEXT SESSION: assess response to increased resistance, continue walking endurance, work on posture, sit to stand, lumbar and hip flexibility.    Mliss Cummins, PT  06/03/24 10:51 AM  [facility] [address] [place], [place] [address] Phone # [phone] Fax [phone]

## 2024-06-03 ENCOUNTER — Encounter: Payer: Self-pay | Admitting: Physical Therapy

## 2024-06-03 ENCOUNTER — Ambulatory Visit: Admitting: Physical Therapy

## 2024-06-03 DIAGNOSIS — R252 Cramp and spasm: Secondary | ICD-10-CM

## 2024-06-03 DIAGNOSIS — R2689 Other abnormalities of gait and mobility: Secondary | ICD-10-CM | POA: Diagnosis not present

## 2024-06-03 DIAGNOSIS — M546 Pain in thoracic spine: Secondary | ICD-10-CM | POA: Diagnosis not present

## 2024-06-03 DIAGNOSIS — M6281 Muscle weakness (generalized): Secondary | ICD-10-CM

## 2024-06-03 DIAGNOSIS — M5459 Other low back pain: Secondary | ICD-10-CM | POA: Diagnosis not present

## 2024-06-08 ENCOUNTER — Ambulatory Visit

## 2024-06-08 DIAGNOSIS — M546 Pain in thoracic spine: Secondary | ICD-10-CM | POA: Diagnosis not present

## 2024-06-08 DIAGNOSIS — M5459 Other low back pain: Secondary | ICD-10-CM | POA: Diagnosis not present

## 2024-06-08 DIAGNOSIS — R2689 Other abnormalities of gait and mobility: Secondary | ICD-10-CM

## 2024-06-08 DIAGNOSIS — M6281 Muscle weakness (generalized): Secondary | ICD-10-CM | POA: Diagnosis not present

## 2024-06-08 DIAGNOSIS — R252 Cramp and spasm: Secondary | ICD-10-CM | POA: Diagnosis not present

## 2024-06-08 NOTE — Therapy (Signed)
 OUTPATIENT PHYSICAL THERAPY TREATMENT  Patient Name: Kerry Wright MRN: [id number] DOB:1941/04/09, 83 y.o., female Today's Date: 06/08/2024    END OF SESSION:  PT End of Session - 06/08/24 1100     Visit Number 22    Date for PT Re-Evaluation 06/29/24    Authorization Type [employer]    Progress Note Due on Visit 30    PT Start Time 1016    PT Stop Time 1058    PT Time Calculation (min) 42 min    Activity Tolerance Patient tolerated treatment well    Behavior During Therapy WFL for tasks assessed/performed                 Past Medical History:  Diagnosis Date   Complication of anesthesia    Headache(784.0)    hx. migraines - less occurring now.   History of kidney stones    remains with kidney stones(not a problem at this time)   Hypertension    Hyperthyroidism    hx. Graves disease 'tx.with radioactive iodine   Osteoarthritis    PONV (postoperative nausea and vomiting)    Past Surgical History:  Procedure Laterality Date   COLONOSCOPY WITH PROPOFOL  N/A 07/31/2014   Procedure: COLONOSCOPY WITH PROPOFOL ;  Surgeon: Gladis MARLA Louder, MD;  Location: [facility];  Service: Endoscopy;  Laterality: N/A;   TONSILLECTOMY     child   There are no active problems to display for this patient.   REFERRING PROVIDER/PCP: Dwight Ave, MD   REFERRING DIAG: M54.9 (ICD-10-CM) - Back pain, Rt sided thoracic pain with muscle spasms  Rationale for Evaluation and Treatment: Rehabilitation  THERAPY DIAG:  Pain in thoracic spine  Other low back pain  Other abnormalities of gait and mobility  Muscle weakness (generalized)  Cramp and spasm  ONSET DATE: 4 month history   SUBJECTIVE:                                                                                                                                                                                           SUBJECTIVE STATEMENT: I had spasms in my back on Saturday for the first time in over a week.    From Evaluation: Pt present to PT with Rt>Lt thoracic pain and spasms that began 10/2023. MD has prescribed muscle relaxers and is not sure if it is helping. She walks with a quad cane with significant trunk flexion.     PERTINENT HISTORY:  HTN, Rt hip OA (needs THA), migraines   PAIN: 06/08/24 Are you having pain? Yes: NPRS scale: 0/10 Pain location: bil thoracic and low back  Pain description: shooting, aching  Aggravating factors: early morning, bending over to reach, sometimes just sitting still , random  Relieving factors: pain patch, Aleve, Excedrin  PRECAUTIONS: Fall  RED FLAGS: None   WEIGHT BEARING RESTRICTIONS: No  FALLS:  Has patient fallen in last 6 months? No  LIVING ENVIRONMENT: Lives with: lives alone Lives in: House/apartment Stairs: Yes: Internal: 12 steps; on right going uplives on main level, doesn't need to go up steps  Has following equipment at home: Chiropractor, Environmental consultant - 4 wheeled, and shower chair  OCCUPATION: retired   PLOF: Independent with community mobility without device and Leisure: watches TV, reading, crossword puzzles, uses hand weights for exercise  PATIENT GOALS: reduce back pain, improve stability   NEXT MD VISIT: May 2025  OBJECTIVE:  Note: Objective measures were completed at Evaluation unless otherwise noted.  DIAGNOSTIC FINDINGS:  CT 03/28/24 IMPRESSION: 1. Thoracic spondylosis and degenerative disc disease, with bridging anterior interbody spurring all levels between T7 and T12, and non bridging anterior interbody spurring at T5-6. 2. Mild thoracic kyphosis and dextroconvex thoracic scoliosis. 3.  Aortic Atherosclerosis (ICD10-I70.0).  Coronary atherosclerosis. 4. Suspected cholelithiasis. 5. Prominence of stool throughout the colon compatible with constipation.  PATIENT SURVEYS:  03/07/24: Modified Oswestry 18/45 =40% disability  04/22/24   Modified Oswestry 9 / 50 = 18.0 %  COGNITION: Overall cognitive status:  Within functional limits for tasks assessed     SENSATION: WFL   POSTURE: rounded shoulders, forward head, flexed trunk , and weight shift left  PALPATION: Palpable tenderness over Lt>Rt thoracic paraspinals, Lt rhomboids and lumbar paraspinals   LOWER EXTREMITY MMT:    Rt hip rests in ER  4/5, Lt hip 4+/5, bil knees 4+/5   FUNCTIONAL TESTS:  04/06/24: 5x sit to stand: 20.3 seconds with controlled descent  3 min walk test: 255 feet with 7/10 RPE  03/08/24: 5 times sit to stand: 20.02 seconds with uncontrolled descent 03/16/24: 3 min walk test 264 feet with 8/10 RPE  04/22/24 3 min walk test 390 ft with 8-9/10 RPE  5XSTS 19.16 sec with controlled descent  05/04/24: 5x sit to stand 20 seconds   06/01/24 3 min walk test 357 ft with 7/10 RPE  5XSTS 15.85 sec  with controlled descent  GAIT: Distance walked: 50 Assistive device utilized: Quad cane large base Level of assistance: Modified independence Comments: ER at Rt hip, flexed trunk  TREATMENT DATE:  06/08/24: NuStep: level 5x 10 minutes- PT present to discuss progress Sit to stand 1x 10, then Sit to stand with slow descent x 10 Seated trunk ext and diagonals x 10 ea 5# kettlebell and following with eyes Seated hip to hip, V's (shoulder/waist/shoulder), ear to ear 5# kettlebell: x10 Steam engines 3 # weights 2x10 Biceps curl 4# x 20,  Seated horizontal ABD 2# x 10 Seated lateral raises 1# x 10 - easy then did 2# OH press 3# x20 reps  Seated lumbar ext with magenta powercord x 10 Long arc quads x 20, marching and ER with 4# added to ankles 2x10    06/03/24: NuStep: level 5x 10 minutes- PT present to discuss progress Sit to stand 1x 10, then Sit to stand with slow descent x 10 Seated trunk ext and diagonals x 10 ea 5# kettlebell and following with eyes Seated hip to hip, V's (shoulder/waist/shoulder), ear to ear 5# kettlebell: x10 Steam engines 3 # weights 2x10 Biceps curl 4# x 20,  Seated horizontal ABD 2# x 10 Seated  lateral raises 1# x 10 -  easy then did 2# OH press 3# x20 reps  Seated lumbar ext with magenta powercord x 10 Long arc quads x 20, marching and ER with 4# added to ankles 2x10   06/01/24: 3 MWT 5XSTS Goals assessed Sit to stand 2x5  Seated trunk ext and diagonals x 10 ea 5# kettlebell and following with eyes Seated hip to hip, V's (shoulder/waist/shoulder), ear to ear 5# kettlebell: x20 Steam engines 3 # weights 2x10 Biceps curl to OH press 3# 2 x10 reps  Seated abdominal crunch with 5# KB x 10 (holding at chest) Seated lumbar ext with magenta powercord x 10 Long arc quads 3# x 30, marching and ER with 3# added to ankles 2x10 NuStep: level 5x 10 minutes- PT present to discuss progress  PATIENT EDUCATION:  Education details: [email or web address], HEP update Person educated: Patient Education method: Explanation, Demonstration, and Handouts Education comprehension: verbalized understanding and returned demonstration  HOME EXERCISE PROGRAM: Access Code: [email or web address] URL: [email or web address] Date: 03/23/2024 Prepared by: Burnard  Exercises - Supine Hip Internal and External Rotation  - 1 x daily - 7 x weekly - 3 sets - 10 reps - Seated Scapular Retraction  - 5 x daily - 7 x weekly - 1 sets - 10 reps - 5 hold - Seated Thoracic Flexion and Rotation with Arms Crossed  - 3 x daily - 7 x weekly - 1 sets - 3 reps - 10 hold - Seated Correct Posture  - 1 x daily - 7 x weekly - 3 sets - 10 reps - Correct Standing Posture  - 1 x daily - 7 x weekly - 3 sets - 10 reps - Seated Hamstring Stretch  - 3 x daily - 7 x weekly - 1 sets - 3 reps - 20 hold - Supine Lower Trunk Rotation  - 3 x daily - 7 x weekly - 1 sets - 3 reps - 20 hold - Seated Thoracic Extension Arms Overhead  - 1 x daily - 7 x weekly - 1-2 sets - 10 reps - 2-3 sec hold - Seated Trunk Rotation  - 1 x daily - 7 x weekly - 1-2 sets - 10 reps - 2-3 sec hold - Seated Diagonal Chops with Medicine Ball  - 1 x daily - 7 x weekly - 1-2  sets - 10 reps - 2-3 hold - Seated Bilateral Shoulder External Rotation with Resistance  - 1 x daily - 7 x weekly - 2 sets - 10 reps - Seated Shoulder Horizontal Abduction with Resistance  - 1 x daily - 7 x weekly - 2 sets - 10 reps ASSESSMENT:  CLINICAL IMPRESSION: Patient was able to consistently increase resistance with upper and lower extremity exercises.  Frequency of back spasms is reduced especially with rotational activities and spasms ard more manageable. Marching is challenging on the left with 4#, but pt able to do ER and LAQs. Patient reporting rotational exercises have helped her be able perform functional tasks at home.      OBJECTIVE IMPAIRMENTS: Abnormal gait, decreased activity tolerance, decreased balance, decreased mobility, difficulty walking, decreased strength, decreased safety awareness, hypomobility, increased muscle spasms, impaired flexibility, improper body mechanics, postural dysfunction, and pain.   ACTIVITY LIMITATIONS: carrying, lifting, standing, squatting, stairs, transfers, and locomotion level  PARTICIPATION LIMITATIONS: meal prep, cleaning, laundry, driving, community activity, and church  PERSONAL FACTORS: Age, Time since onset of injury/illness/exacerbation, and 1-2 comorbidities: Lt hip OA, chronic gait abnormality are also affecting patient's functional outcome.   REHAB POTENTIAL: Good  CLINICAL  DECISION MAKING: Evolving/moderate complexity  EVALUATION COMPLEXITY: Moderate   GOALS: Goals reviewed with patient? Yes  SHORT TERM GOALS: Target date: 05/25/2024      Be independent in initial HEP Baseline: Goal status: MET  2.  Ambulate 400 feet with 3 min walk test Baseline: 390 feet with RPE 8-9/10 (04/27/24) Goal status: NEW  3.  Report > or = to 60% reduction in LBP/thoracic pain with ADLs and self-care Baseline: 80% reduction in frequency (06/01/24) Goal status: MET  4.  Improve LE strength to perform stand to sit with controlled descent x  5 reps  Baseline: able to do this with controlled descent, on toe of Rt foot (04/06/24 Goal status: MET  5.  Report postural corrections at home to reduce lumbar/thoracic stress with standing Baseline: has been using walker to assist, still very challenged with this (05/04/24) Goal status: In progress    LONG TERM GOALS: Target date: 06/29/2024      Be independent in advanced HEP Baseline:  Goal status: in progress   2.  Ambulate 425 feet with 3 min walk test Baseline:310 feet Goal status: IN PROGRESS 06/01/24 357 ft   3.  Report > or = to 70% reduction in LBP/thoracic pain with ADLs and self-care Baseline: 80% reduction in frequency (06/01/24) Goal status: MET  4.  Perform 5x sit to stand in < or = to 15 seconds with min UE support to reduce falls risk Baseline: 15.85 seconds  (06/01/24) Goal status: IN PROGRESS  5.  Improve endurance to perform 3 min walk test with RPE < or = to 5/10 Baseline: 8-9/10 Goal status: IN PROGRESS 06/01/24 7/10 RPE   PLAN:  PT FREQUENCY: 2x/week  PT DURATION: 8 weeks  PLANNED INTERVENTIONS: 97110-Therapeutic exercises, 97530- Therapeutic activity, 97112- Neuromuscular re-education, 97535- Self Care, 02859- Manual therapy, [phone]- Gait training, [phone]- Canalith repositioning, [id number]- Aquatic Therapy, [phone]- Electrical stimulation (unattended), [phone]- Electrical stimulation (manual), Patient/Family education, Stair training, Taping, Dry Needling, Joint mobilization, Spinal manipulation, Spinal mobilization, Vestibular training, Cryotherapy, and Moist heat.  PLAN FOR NEXT SESSION: advance resistance/reps as needed, continue walking endurance, work on posture, sit to stand, lumbar and hip flexibility.    Burnard Joy, PT 06/08/24 11:02 AM  [facility] [address], [address] [place], [place] [address] Phone # [phone] Fax [phone]

## 2024-06-09 NOTE — Therapy (Signed)
 OUTPATIENT PHYSICAL THERAPY TREATMENT  Patient Name: Kerry Wright MRN: [id number] DOB:December 11, 1940, 83 y.o., female Today's Date: 06/10/2024    END OF SESSION:  PT End of Session - 06/10/24 1005     Visit Number 23    Date for PT Re-Evaluation 06/29/24    Progress Note Due on Visit 30    PT Start Time 1005    PT Stop Time 1046    PT Time Calculation (min) 41 min    Activity Tolerance Patient tolerated treatment well    Behavior During Therapy WFL for tasks assessed/performed                  Past Medical History:  Diagnosis Date   Complication of anesthesia    Headache(784.0)    hx. migraines - less occurring now.   History of kidney stones    remains with kidney stones(not a problem at this time)   Hypertension    Hyperthyroidism    hx. Graves disease 'tx.with radioactive iodine   Osteoarthritis    PONV (postoperative nausea and vomiting)    Past Surgical History:  Procedure Laterality Date   COLONOSCOPY WITH PROPOFOL  N/A 07/31/2014   Procedure: COLONOSCOPY WITH PROPOFOL ;  Surgeon: Gladis MARLA Louder, MD;  Location: [facility];  Service: Endoscopy;  Laterality: N/A;   TONSILLECTOMY     child   There are no active problems to display for this patient.   REFERRING PROVIDER/PCP: Dwight Ave, MD   REFERRING DIAG: M54.9 (ICD-10-CM) - Back pain, Rt sided thoracic pain with muscle spasms  Rationale for Evaluation and Treatment: Rehabilitation  THERAPY DIAG:  Pain in thoracic spine  Other low back pain  Other abnormalities of gait and mobility  Muscle weakness (generalized)  Cramp and spasm  ONSET DATE: 4 month history   SUBJECTIVE:                                                                                                                                                                                           SUBJECTIVE STATEMENT: I'm working on a headache today.  From Evaluation: Pt present to PT with Rt>Lt thoracic pain and spasms that  began 10/2023. MD has prescribed muscle relaxers and is not sure if it is helping. She walks with a quad cane with significant trunk flexion.     PERTINENT HISTORY:  HTN, Rt hip OA (needs THA), migraines   PAIN: 06/08/24 Are you having pain? Yes: NPRS scale: 0/10 Pain location: bil thoracic and low back  Pain description: shooting, aching  Aggravating factors: early morning, bending over to reach, sometimes just sitting still , random  Relieving  factors: pain patch, Aleve, Excedrin  PRECAUTIONS: Fall  RED FLAGS: None   WEIGHT BEARING RESTRICTIONS: No  FALLS:  Has patient fallen in last 6 months? No  LIVING ENVIRONMENT: Lives with: lives alone Lives in: House/apartment Stairs: Yes: Internal: 12 steps; on right going uplives on main level, doesn't need to go up steps  Has following equipment at home: Chiropractor, Environmental consultant - 4 wheeled, and shower chair  OCCUPATION: retired   PLOF: Independent with community mobility without device and Leisure: watches TV, reading, crossword puzzles, uses hand weights for exercise  PATIENT GOALS: reduce back pain, improve stability   NEXT MD VISIT: May 2025  OBJECTIVE:  Note: Objective measures were completed at Evaluation unless otherwise noted.  DIAGNOSTIC FINDINGS:  CT 03/28/24 IMPRESSION: 1. Thoracic spondylosis and degenerative disc disease, with bridging anterior interbody spurring all levels between T7 and T12, and non bridging anterior interbody spurring at T5-6. 2. Mild thoracic kyphosis and dextroconvex thoracic scoliosis. 3.  Aortic Atherosclerosis (ICD10-I70.0).  Coronary atherosclerosis. 4. Suspected cholelithiasis. 5. Prominence of stool throughout the colon compatible with constipation.  PATIENT SURVEYS:  03/07/24: Modified Oswestry 18/45 =40% disability  04/22/24   Modified Oswestry 9 / 50 = 18.0 %  COGNITION: Overall cognitive status: Within functional limits for tasks assessed     SENSATION: WFL   POSTURE:  rounded shoulders, forward head, flexed trunk , and weight shift left  PALPATION: Palpable tenderness over Lt>Rt thoracic paraspinals, Lt rhomboids and lumbar paraspinals   LOWER EXTREMITY MMT:    Rt hip rests in ER  4/5, Lt hip 4+/5, bil knees 4+/5   FUNCTIONAL TESTS:  04/06/24: 5x sit to stand: 20.3 seconds with controlled descent  3 min walk test: 255 feet with 7/10 RPE  03/08/24: 5 times sit to stand: 20.02 seconds with uncontrolled descent 03/16/24: 3 min walk test 264 feet with 8/10 RPE  04/22/24 3 min walk test 390 ft with 8-9/10 RPE  5XSTS 19.16 sec with controlled descent  05/04/24: 5x sit to stand 20 seconds   06/01/24 3 min walk test 357 ft with 7/10 RPE  5XSTS 15.85 sec  with controlled descent  06/10/24  3 min walk 335 ft 6/10  GAIT: Distance walked: 50 Assistive device utilized: Quad cane large base Level of assistance: Modified independence Comments: ER at Rt hip, flexed trunk  TREATMENT DATE:  06/10/24: NuStep: level 5x 10 minutes- PT present to discuss progress Sit to stand 1x 10, then Sit to stand with slow descent x 10 Seated L soleus stretch on leg of walker 2 x 30 sec Seated trunk ext and diagonals x 10 ea 5# kettlebell and following with eyes Seated hip to hip, V's (shoulder/waist/shoulder), ear to ear 5# kettlebell: x10 Steam engines 3 # weights 2x10 Biceps curl 4# x 20,  Seated horizontal ABD 2# x 10 Seated lateral raises  2# x10, 3# x 10 OH press 3# x20 reps   335 ft with RPE of 6/10  06/08/24: NuStep: level 5x 10 minutes- PT present to discuss progress Sit to stand 1x 10, then Sit to stand with slow descent x 10 Seated trunk ext and diagonals x 10 ea 5# kettlebell and following with eyes Seated hip to hip, V's (shoulder/waist/shoulder), ear to ear 5# kettlebell: x10 Steam engines 3 # weights 2x10 Biceps curl 4# x 20,  Seated horizontal ABD 2# x 10 Seated lateral raises 1# x 10 - easy then did 2# OH press 3# x20 reps  Seated lumbar ext  with  magenta powercord x 10 Long arc quads x 20, marching and ER with 4# added to ankles 2x10    06/03/24: NuStep: level 5x 10 minutes- PT present to discuss progress Sit to stand 1x 10, then Sit to stand with slow descent x 10 Seated trunk ext and diagonals x 10 ea 5# kettlebell and following with eyes Seated hip to hip, V's (shoulder/waist/shoulder), ear to ear 5# kettlebell: x10 Steam engines 3 # weights 2x10 Biceps curl 4# x 20,  Seated horizontal ABD 2# x 10 Seated lateral raises 1# x 10 - easy then did 2# OH press 3# x20 reps  Seated lumbar ext with magenta powercord x 10 Long arc quads x 20, marching and ER with 4# added to ankles 2x10   06/01/24: 3 MWT 5XSTS Goals assessed Sit to stand 2x5  Seated trunk ext and diagonals x 10 ea 5# kettlebell and following with eyes Seated hip to hip, V's (shoulder/waist/shoulder), ear to ear 5# kettlebell: x20 Steam engines 3 # weights 2x10 Biceps curl to OH press 3# 2 x10 reps  Seated abdominal crunch with 5# KB x 10 (holding at chest) Seated lumbar ext with magenta powercord x 10 Long arc quads 3# x 30, marching and ER with 3# added to ankles 2x10 NuStep: level 5x 10 minutes- PT present to discuss progress  PATIENT EDUCATION:  Education details: [email or web address], HEP update Person educated: Patient Education method: Explanation, Demonstration, and Handouts Education comprehension: verbalized understanding and returned demonstration  HOME EXERCISE PROGRAM: Access Code: [email or web address] URL: [email or web address] Date: 03/23/2024 Prepared by: Burnard  Exercises - Supine Hip Internal and External Rotation  - 1 x daily - 7 x weekly - 3 sets - 10 reps - Seated Scapular Retraction  - 5 x daily - 7 x weekly - 1 sets - 10 reps - 5 hold - Seated Thoracic Flexion and Rotation with Arms Crossed  - 3 x daily - 7 x weekly - 1 sets - 3 reps - 10 hold - Seated Correct Posture  - 1 x daily - 7 x weekly - 3 sets - 10 reps - Correct Standing Posture   - 1 x daily - 7 x weekly - 3 sets - 10 reps - Seated Hamstring Stretch  - 3 x daily - 7 x weekly - 1 sets - 3 reps - 20 hold - Supine Lower Trunk Rotation  - 3 x daily - 7 x weekly - 1 sets - 3 reps - 20 hold - Seated Thoracic Extension Arms Overhead  - 1 x daily - 7 x weekly - 1-2 sets - 10 reps - 2-3 sec hold - Seated Trunk Rotation  - 1 x daily - 7 x weekly - 1-2 sets - 10 reps - 2-3 sec hold - Seated Diagonal Chops with Medicine Ball  - 1 x daily - 7 x weekly - 1-2 sets - 10 reps - 2-3 hold - Seated Bilateral Shoulder External Rotation with Resistance  - 1 x daily - 7 x weekly - 2 sets - 10 reps - Seated Shoulder Horizontal Abduction with Resistance  - 1 x daily - 7 x weekly - 2 sets - 10 reps ASSESSMENT:  CLINICAL IMPRESSION: Patient reporting no spasms since last Saturday. She tolerated increased reps with some UE exercises today. She demonstrates improved R knee ext with sit to stand (without cues today). She covered less distance (- 20 ft) with today and her RPE was 6/10. She continues to demonstrate potential for improvement and  would benefit from continued skilled therapy to address impairments.      OBJECTIVE IMPAIRMENTS: Abnormal gait, decreased activity tolerance, decreased balance, decreased mobility, difficulty walking, decreased strength, decreased safety awareness, hypomobility, increased muscle spasms, impaired flexibility, improper body mechanics, postural dysfunction, and pain.   ACTIVITY LIMITATIONS: carrying, lifting, standing, squatting, stairs, transfers, and locomotion level  PARTICIPATION LIMITATIONS: meal prep, cleaning, laundry, driving, community activity, and church  PERSONAL FACTORS: Age, Time since onset of injury/illness/exacerbation, and 1-2 comorbidities: Lt hip OA, chronic gait abnormality are also affecting patient's functional outcome.   REHAB POTENTIAL: Good  CLINICAL DECISION MAKING: Evolving/moderate complexity  EVALUATION COMPLEXITY:  Moderate   GOALS: Goals reviewed with patient? Yes  SHORT TERM GOALS: Target date: 05/25/2024      Be independent in initial HEP Baseline: Goal status: MET  2.  Ambulate 400 feet with 3 min walk test Baseline: 390 feet with RPE 8-9/10 (04/27/24) Goal status: NEW  3.  Report > or = to 60% reduction in LBP/thoracic pain with ADLs and self-care Baseline: 80% reduction in frequency (06/01/24) Goal status: MET  4.  Improve LE strength to perform stand to sit with controlled descent x 5 reps  Baseline: able to do this with controlled descent, on toe of Rt foot (04/06/24 Goal status: MET  5.  Report postural corrections at home to reduce lumbar/thoracic stress with standing Baseline: has been using walker to assist, still very challenged with this (05/04/24) Goal status: In progress    LONG TERM GOALS: Target date: 06/29/2024      Be independent in advanced HEP Baseline:  Goal status: in progress   2.  Ambulate 425 feet with 3 min walk test Baseline:310 feet Goal status: IN PROGRESS 06/01/24 357 ft   3.  Report > or = to 70% reduction in LBP/thoracic pain with ADLs and self-care Baseline: 80% reduction in frequency (06/01/24) Goal status: MET  4.  Perform 5x sit to stand in < or = to 15 seconds with min UE support to reduce falls risk Baseline: 15.85 seconds  (06/01/24) Goal status: IN PROGRESS  5.  Improve endurance to perform 3 min walk test with RPE < or = to 5/10 Baseline: 8-9/10 Goal status: IN PROGRESS 06/01/24 7/10 RPE   PLAN:  PT FREQUENCY: 2x/week  PT DURATION: 8 weeks  PLANNED INTERVENTIONS: 97110-Therapeutic exercises, 97530- Therapeutic activity, 97112- Neuromuscular re-education, 97535- Self Care, 02859- Manual therapy, [phone]- Gait training, [phone]- Canalith repositioning, [id number]- Aquatic Therapy, [phone]- Electrical stimulation (unattended), [phone]- Electrical stimulation (manual), Patient/Family education, Stair training, Taping, Dry Needling, Joint mobilization,  Spinal manipulation, Spinal mobilization, Vestibular training, Cryotherapy, and Moist heat.  PLAN FOR NEXT SESSION: advance resistance/reps as needed, continue walking endurance, work on posture, sit to stand, lumbar and hip flexibility.    Mliss Cummins, PT  06/10/24 12:05 PM  [facility] [address] [place], [place] [address] Phone # [phone] Fax [phone]

## 2024-06-10 ENCOUNTER — Ambulatory Visit: Admitting: Physical Therapy

## 2024-06-10 ENCOUNTER — Encounter: Payer: Self-pay | Admitting: Physical Therapy

## 2024-06-10 DIAGNOSIS — R252 Cramp and spasm: Secondary | ICD-10-CM | POA: Diagnosis not present

## 2024-06-10 DIAGNOSIS — M5459 Other low back pain: Secondary | ICD-10-CM | POA: Diagnosis not present

## 2024-06-10 DIAGNOSIS — M6281 Muscle weakness (generalized): Secondary | ICD-10-CM

## 2024-06-10 DIAGNOSIS — M546 Pain in thoracic spine: Secondary | ICD-10-CM | POA: Diagnosis not present

## 2024-06-10 DIAGNOSIS — R2689 Other abnormalities of gait and mobility: Secondary | ICD-10-CM | POA: Diagnosis not present

## 2024-06-15 ENCOUNTER — Encounter: Payer: Self-pay | Admitting: Physical Therapy

## 2024-06-15 ENCOUNTER — Ambulatory Visit: Admitting: Physical Therapy

## 2024-06-15 DIAGNOSIS — R2689 Other abnormalities of gait and mobility: Secondary | ICD-10-CM

## 2024-06-15 DIAGNOSIS — M6281 Muscle weakness (generalized): Secondary | ICD-10-CM | POA: Diagnosis not present

## 2024-06-15 DIAGNOSIS — M546 Pain in thoracic spine: Secondary | ICD-10-CM | POA: Diagnosis not present

## 2024-06-15 DIAGNOSIS — R252 Cramp and spasm: Secondary | ICD-10-CM

## 2024-06-15 DIAGNOSIS — M5459 Other low back pain: Secondary | ICD-10-CM | POA: Diagnosis not present

## 2024-06-15 NOTE — Therapy (Signed)
 OUTPATIENT PHYSICAL THERAPY TREATMENT  Patient Name: Kerry Wright MRN: [id number] DOB:[birth year], 83 y.o., female Today's Date: 06/15/2024    END OF SESSION:  PT End of Session - 06/15/24 1109     Visit Number 24    Date for PT Re-Evaluation 06/29/24    Authorization Type [employer]    Progress Note Due on Visit 30    PT Start Time 1102    PT Stop Time 1145    PT Time Calculation (min) 43 min    Activity Tolerance Patient tolerated treatment well    Behavior During Therapy WFL for tasks assessed/performed                  Past Medical History:  Diagnosis Date   Complication of anesthesia    Headache(784.0)    hx. migraines - less occurring now.   History of kidney stones    remains with kidney stones(not a problem at this time)   Hypertension    Hyperthyroidism    hx. Graves disease 'tx.with radioactive iodine   Osteoarthritis    PONV (postoperative nausea and vomiting)    Past Surgical History:  Procedure Laterality Date   COLONOSCOPY WITH PROPOFOL  N/A 07/31/2014   Procedure: COLONOSCOPY WITH PROPOFOL ;  Surgeon: Gladis MARLA Louder, MD;  Location: [facility];  Service: Endoscopy;  Laterality: N/A;   TONSILLECTOMY     child   There are no active problems to display for this patient.   REFERRING PROVIDER/PCP: Dwight Ave, MD   REFERRING DIAG: M54.9 (ICD-10-CM) - Back pain, Rt sided thoracic pain with muscle spasms  Rationale for Evaluation and Treatment: Rehabilitation  THERAPY DIAG:  Pain in thoracic spine  Other low back pain  Other abnormalities of gait and mobility  Muscle weakness (generalized)  Cramp and spasm  ONSET DATE: 4 month history   SUBJECTIVE:                                                                                                                                                                                           SUBJECTIVE STATEMENT: Tried to get a spasm this morning, but I put a pain patch on it and  stopped it.  From Evaluation: Pt present to PT with Rt>Lt thoracic pain and spasms that began 10/2023. MD has prescribed muscle relaxers and is not sure if it is helping. She walks with a quad cane with significant trunk flexion.     PERTINENT HISTORY:  HTN, Rt hip OA (needs THA), migraines   PAIN: 06/08/24 Are you having pain? Yes: NPRS scale: 0/10 Pain location: bil thoracic and low back  Pain description:  shooting, aching  Aggravating factors: early morning, bending over to reach, sometimes just sitting still , random  Relieving factors: pain patch, Aleve, Excedrin  PRECAUTIONS: Fall  RED FLAGS: None   WEIGHT BEARING RESTRICTIONS: No  FALLS:  Has patient fallen in last 6 months? No  LIVING ENVIRONMENT: Lives with: lives alone Lives in: House/apartment Stairs: Yes: Internal: 12 steps; on right going uplives on main level, doesn't need to go up steps  Has following equipment at home: Chiropractor, Environmental consultant - 4 wheeled, and shower chair  OCCUPATION: retired   PLOF: Independent with community mobility without device and Leisure: watches TV, reading, crossword puzzles, uses hand weights for exercise  PATIENT GOALS: reduce back pain, improve stability   NEXT MD VISIT: May 2025  OBJECTIVE:  Note: Objective measures were completed at Evaluation unless otherwise noted.  DIAGNOSTIC FINDINGS:  CT 03/28/24 IMPRESSION: 1. Thoracic spondylosis and degenerative disc disease, with bridging anterior interbody spurring all levels between T7 and T12, and non bridging anterior interbody spurring at T5-6. 2. Mild thoracic kyphosis and dextroconvex thoracic scoliosis. 3.  Aortic Atherosclerosis (ICD10-I70.0).  Coronary atherosclerosis. 4. Suspected cholelithiasis. 5. Prominence of stool throughout the colon compatible with constipation.  PATIENT SURVEYS:  03/07/24: Modified Oswestry 18/45 =40% disability  04/22/24   Modified Oswestry 9 / 50 = 18.0 %  COGNITION: Overall  cognitive status: Within functional limits for tasks assessed     SENSATION: WFL   POSTURE: rounded shoulders, forward head, flexed trunk , and weight shift left  PALPATION: Palpable tenderness over Lt>Rt thoracic paraspinals, Lt rhomboids and lumbar paraspinals   LOWER EXTREMITY MMT:    Rt hip rests in ER  4/5, Lt hip 4+/5, bil knees 4+/5   FUNCTIONAL TESTS:  04/06/24: 5x sit to stand: 20.3 seconds with controlled descent  3 min walk test: 255 feet with 7/10 RPE  03/08/24: 5 times sit to stand: 20.02 seconds with uncontrolled descent 03/16/24: 3 min walk test 264 feet with 8/10 RPE  04/22/24 3 min walk test 390 ft with 8-9/10 RPE  5XSTS 19.16 sec with controlled descent  05/04/24: 5x sit to stand 20 seconds   06/01/24 3 min walk test 357 ft with 7/10 RPE  5XSTS 15.85 sec  with controlled descent  06/10/24  3 min walk 335 ft 6/10  06/15/24 5XSTS 11.89 sec  GAIT: Distance walked: 50 Assistive device utilized: Quad cane large base Level of assistance: Modified independence Comments: ER at Rt hip, flexed trunk  TREATMENT DATE:  06/15/24: NuStep: level 5x 10 minutes- PT present to discuss progress Sit to stand 1x 5, 1x10 Seated L soleus stretch on leg of walker 2 x 30 sec Seated trunk ext and diagonals x 10 ea 5# kettlebell and following with eyes  Seated hip to hip, V's (shoulder/waist/shoulder), ear to ear 5# kettlebell: 2x10 Steam engines 3 # weights 2x10 Biceps curl 4# 2x 10,  Seated horizontal ABD 2# x 10 Seated lateral raises  2# x10, 3# x 10 OH press 3# x20 reps  Long arc quads x 20, marching and ER with 4# added to ankles 2x10   06/10/24: NuStep: level 5x 10 minutes- PT present to discuss progress Sit to stand 1x 10, then Sit to stand with slow descent x 10 Seated L soleus stretch on leg of walker 2 x 30 sec Seated trunk ext and diagonals x 10 ea 5# kettlebell and following with eyes Seated hip to hip, V's (shoulder/waist/shoulder), ear to ear 5# kettlebell:  x10 Steam  engines 3 # weights 2x10 Biceps curl 4# x 20,  Seated horizontal ABD 2# x 10 Seated lateral raises  2# x10, 3# x 10 OH press 3# x20 reps   335 ft with RPE of 6/10  06/08/24: NuStep: level 5x 10 minutes- PT present to discuss progress Sit to stand 1x 10, then Sit to stand with slow descent x 10 Seated trunk ext and diagonals x 10 ea 5# kettlebell and following with eyes Seated hip to hip, V's (shoulder/waist/shoulder), ear to ear 5# kettlebell: x10 Steam engines 3 # weights 2x10 Biceps curl 4# x 20,  Seated horizontal ABD 2# x 10 Seated lateral raises 1# x 10 - easy then did 2# OH press 3# x20 reps  Seated lumbar ext with magenta powercord x 10 Long arc quads x 20, marching and ER with 4# added to ankles 2x10    06/03/24: NuStep: level 5x 10 minutes- PT present to discuss progress Sit to stand 1x 10, then Sit to stand with slow descent x 10 Seated trunk ext and diagonals x 10 ea 5# kettlebell and following with eyes Seated hip to hip, V's (shoulder/waist/shoulder), ear to ear 5# kettlebell: x10 Steam engines 3 # weights 2x10 Biceps curl 4# x 20,  Seated horizontal ABD 2# x 10 Seated lateral raises 1# x 10 - easy then did 2# OH press 3# x20 reps  Seated lumbar ext with magenta powercord x 10 Long arc quads x 20, marching and ER with 4# added to ankles 2x10   06/01/24: 3 MWT 5XSTS Goals assessed Sit to stand 2x5  Seated trunk ext and diagonals x 10 ea 5# kettlebell and following with eyes Seated hip to hip, V's (shoulder/waist/shoulder), ear to ear 5# kettlebell: x20 Steam engines 3 # weights 2x10 Biceps curl to OH press 3# 2 x10 reps  Seated abdominal crunch with 5# KB x 10 (holding at chest) Seated lumbar ext with magenta powercord x 10 Long arc quads 3# x 30, marching and ER with 3# added to ankles 2x10 NuStep: level 5x 10 minutes- PT present to discuss progress  PATIENT EDUCATION:  Education details: [email or web address], HEP update Person educated:  Patient Education method: Explanation, Demonstration, and Handouts Education comprehension: verbalized understanding and returned demonstration  HOME EXERCISE PROGRAM: Access Code: [email or web address] URL: [email or web address] Date: 03/23/2024 Prepared by: Burnard  Exercises - Supine Hip Internal and External Rotation  - 1 x daily - 7 x weekly - 3 sets - 10 reps - Seated Scapular Retraction  - 5 x daily - 7 x weekly - 1 sets - 10 reps - 5 hold - Seated Thoracic Flexion and Rotation with Arms Crossed  - 3 x daily - 7 x weekly - 1 sets - 3 reps - 10 hold - Seated Correct Posture  - 1 x daily - 7 x weekly - 3 sets - 10 reps - Correct Standing Posture  - 1 x daily - 7 x weekly - 3 sets - 10 reps - Seated Hamstring Stretch  - 3 x daily - 7 x weekly - 1 sets - 3 reps - 20 hold - Supine Lower Trunk Rotation  - 3 x daily - 7 x weekly - 1 sets - 3 reps - 20 hold - Seated Thoracic Extension Arms Overhead  - 1 x daily - 7 x weekly - 1-2 sets - 10 reps - 2-3 sec hold - Seated Trunk Rotation  - 1 x daily - 7 x weekly - 1-2 sets - 10 reps -  2-3 sec hold - Seated Diagonal Chops with Medicine Ball  - 1 x daily - 7 x weekly - 1-2 sets - 10 reps - 2-3 hold - Seated Bilateral Shoulder External Rotation with Resistance  - 1 x daily - 7 x weekly - 2 sets - 10 reps - Seated Shoulder Horizontal Abduction with Resistance  - 1 x daily - 7 x weekly - 2 sets - 10 reps ASSESSMENT:  CLINICAL IMPRESSION: Kathalina met her 5XSTS goal today. She demonstrates improved form with increased right knee extension and controlled descent. Tolerated all other TE without pain. She continues to demonstrate potential for improvement and would benefit from continued skilled therapy to address impairments.      OBJECTIVE IMPAIRMENTS: Abnormal gait, decreased activity tolerance, decreased balance, decreased mobility, difficulty walking, decreased strength, decreased safety awareness, hypomobility, increased muscle spasms, impaired  flexibility, improper body mechanics, postural dysfunction, and pain.   ACTIVITY LIMITATIONS: carrying, lifting, standing, squatting, stairs, transfers, and locomotion level  PARTICIPATION LIMITATIONS: meal prep, cleaning, laundry, driving, community activity, and church  PERSONAL FACTORS: Age, Time since onset of injury/illness/exacerbation, and 1-2 comorbidities: Lt hip OA, chronic gait abnormality are also affecting patient's functional outcome.   REHAB POTENTIAL: Good  CLINICAL DECISION MAKING: Evolving/moderate complexity  EVALUATION COMPLEXITY: Moderate   GOALS: Goals reviewed with patient? Yes  SHORT TERM GOALS: Target date: 05/25/2024      Be independent in initial HEP Baseline: Goal status: MET  2.  Ambulate 400 feet with 3 min walk test Baseline: 390 feet with RPE 8-9/10 (04/27/24) Goal status: NEW  3.  Report > or = to 60% reduction in LBP/thoracic pain with ADLs and self-care Baseline: 80% reduction in frequency (06/01/24) Goal status: MET  4.  Improve LE strength to perform stand to sit with controlled descent x 5 reps  Baseline: able to do this with controlled descent, on toe of Rt foot (04/06/24 Goal status: MET  5.  Report postural corrections at home to reduce lumbar/thoracic stress with standing Baseline: has been using walker to assist, still very challenged with this (05/04/24) Goal status: In progress    LONG TERM GOALS: Target date: 06/29/2024      Be independent in advanced HEP Baseline:  Goal status: in progress   2.  Ambulate 425 feet with 3 min walk test Baseline:310 feet Goal status: IN PROGRESS 06/01/24 357 ft   3.  Report > or = to 70% reduction in LBP/thoracic pain with ADLs and self-care Baseline: 80% reduction in frequency (06/01/24) Goal status: MET  4.  Perform 5x sit to stand in < or = to 15 seconds with min UE support to reduce falls risk Baseline: 15.85 seconds  (06/01/24) Goal status: MET 11.89 sec  5.  Improve endurance to  perform 3 min walk test with RPE < or = to 5/10 Baseline: 8-9/10 Goal status: IN PROGRESS 06/01/24 7/10 RPE   PLAN:  PT FREQUENCY: 2x/week  PT DURATION: 8 weeks  PLANNED INTERVENTIONS: 97110-Therapeutic exercises, 97530- Therapeutic activity, 97112- Neuromuscular re-education, 97535- Self Care, [id number]- Manual therapy, [phone]- Gait training, [phone]- Canalith repositioning, [id number]- Aquatic Therapy, [phone]- Electrical stimulation (unattended), [phone]- Electrical stimulation (manual), Patient/Family education, Stair training, Taping, Dry Needling, Joint mobilization, Spinal manipulation, Spinal mobilization, Vestibular training, Cryotherapy, and Moist heat.  PLAN FOR NEXT SESSION: advance resistance/reps as needed, continue walking endurance, work on posture, sit to stand, lumbar and hip flexibility.    Mliss Cummins, PT  06/15/24 11:54 AM  [facility] [address],  [address] [place], [place] [address] Phone # [phone] Fax [phone]

## 2024-06-16 NOTE — Therapy (Signed)
 OUTPATIENT PHYSICAL THERAPY TREATMENT  Patient Name: Kerry Wright MRN: [id number] DOB:Nov 28, 1940, 83 y.o., female Today's Date: 06/17/2024    END OF SESSION:  PT End of Session - 06/17/24 1107     Visit Number 25    Date for PT Re-Evaluation 06/29/24    Authorization Type [employer]    Progress Note Due on Visit 30    PT Start Time 1102    PT Stop Time 1145    PT Time Calculation (min) 43 min    Activity Tolerance Patient tolerated treatment well    Behavior During Therapy WFL for tasks assessed/performed                   Past Medical History:  Diagnosis Date   Complication of anesthesia    Headache(784.0)    hx. migraines - less occurring now.   History of kidney stones    remains with kidney stones(not a problem at this time)   Hypertension    Hyperthyroidism    hx. Graves disease 'tx.with radioactive iodine   Osteoarthritis    PONV (postoperative nausea and vomiting)    Past Surgical History:  Procedure Laterality Date   COLONOSCOPY WITH PROPOFOL  N/A 07/31/2014   Procedure: COLONOSCOPY WITH PROPOFOL ;  Surgeon: Gladis MARLA Louder, MD;  Location: [facility];  Service: Endoscopy;  Laterality: N/A;   TONSILLECTOMY     child   There are no active problems to display for this patient.   REFERRING PROVIDER/PCP: Dwight Ave, MD   REFERRING DIAG: M54.9 (ICD-10-CM) - Back pain, Rt sided thoracic pain with muscle spasms  Rationale for Evaluation and Treatment: Rehabilitation  THERAPY DIAG:  Pain in thoracic spine  Other low back pain  Other abnormalities of gait and mobility  Muscle weakness (generalized)  Cramp and spasm  ONSET DATE: 4 month history   SUBJECTIVE:                                                                                                                                                                                           SUBJECTIVE STATEMENT: I had a spasm on the right side the night after PT, but I was able to  calm it down.   From Evaluation: Pt present to PT with Rt>Lt thoracic pain and spasms that began 10/2023. MD has prescribed muscle relaxers and is not sure if it is helping. She walks with a quad cane with significant trunk flexion.     PERTINENT HISTORY:  HTN, Rt hip OA (needs THA), migraines   PAIN: 06/08/24 Are you having pain? Yes: NPRS scale: 0/10 Pain location: bil thoracic and low  back  Pain description: shooting, aching  Aggravating factors: early morning, bending over to reach, sometimes just sitting still , random  Relieving factors: pain patch, Aleve, Excedrin  PRECAUTIONS: Fall  RED FLAGS: None   WEIGHT BEARING RESTRICTIONS: No  FALLS:  Has patient fallen in last 6 months? No  LIVING ENVIRONMENT: Lives with: lives alone Lives in: House/apartment Stairs: Yes: Internal: 12 steps; on right going uplives on main level, doesn't need to go up steps  Has following equipment at home: Chiropractor, Environmental consultant - 4 wheeled, and shower chair  OCCUPATION: retired   PLOF: Independent with community mobility without device and Leisure: watches TV, reading, crossword puzzles, uses hand weights for exercise  PATIENT GOALS: reduce back pain, improve stability   NEXT MD VISIT: May 2025  OBJECTIVE:  Note: Objective measures were completed at Evaluation unless otherwise noted.  DIAGNOSTIC FINDINGS:  CT 03/28/24 IMPRESSION: 1. Thoracic spondylosis and degenerative disc disease, with bridging anterior interbody spurring all levels between T7 and T12, and non bridging anterior interbody spurring at T5-6. 2. Mild thoracic kyphosis and dextroconvex thoracic scoliosis. 3.  Aortic Atherosclerosis (ICD10-I70.0).  Coronary atherosclerosis. 4. Suspected cholelithiasis. 5. Prominence of stool throughout the colon compatible with constipation.  PATIENT SURVEYS:  03/07/24: Modified Oswestry 18/45 =40% disability  04/22/24   Modified Oswestry 9 / 50 = 18.0 %  COGNITION: Overall  cognitive status: Within functional limits for tasks assessed     SENSATION: WFL   POSTURE: rounded shoulders, forward head, flexed trunk , and weight shift left  PALPATION: Palpable tenderness over Lt>Rt thoracic paraspinals, Lt rhomboids and lumbar paraspinals   LOWER EXTREMITY MMT:    Rt hip rests in ER  4/5, Lt hip 4+/5, bil knees 4+/5   FUNCTIONAL TESTS:  04/06/24: 5x sit to stand: 20.3 seconds with controlled descent  3 min walk test: 255 feet with 7/10 RPE  03/08/24: 5 times sit to stand: 20.02 seconds with uncontrolled descent 03/16/24: 3 min walk test 264 feet with 8/10 RPE  04/22/24 3 min walk test 390 ft with 8-9/10 RPE  5XSTS 19.16 sec with controlled descent  05/04/24: 5x sit to stand 20 seconds   06/01/24 3 min walk test 357 ft with 7/10 RPE  5XSTS 15.85 sec  with controlled descent  06/10/24  3 min walk 335 ft 6/10  06/15/24 5XSTS 11.89 sec  06/17/24  292 ft 5/10  GAIT: Distance walked: 50 Assistive device utilized: Quad cane large base Level of assistance: Modified independence Comments: ER at Rt hip, flexed trunk  TREATMENT DATE:  06/17/24: NuStep: level 5x 10 minutes- PT present to discuss progress Sit to stand 1x10, 1 x 5  slower descent  Seated L soleus stretch on leg of walker 2 x 30 sec Seated trunk ext and diagonals x 10 ea 5# kettlebell and following with eyes  Seated hip to hip with , V's (shoulder/waist/shoulder), ear to ear 8# wt: x10 Steam engines 4 # weights 2x10 Biceps curl 4# 2x 10,  Seated horizontal ABD RTB  x 10 Seated lateral raises , 3# 2 x 10 OH press 3# x20 reps  292 ft RPE 5/10  06/15/24: NuStep: level 5x 10 minutes- PT present to discuss progress Sit to stand 1x 5, 1x10 Seated L soleus stretch on leg of walker 2 x 30 sec Seated trunk ext and diagonals x 10 ea 5# kettlebell and following with eyes  Seated hip to hip, V's (shoulder/waist/shoulder), ear to ear 5# kettlebell: 2x10 Steam engines  3 # weights 2x10 Biceps curl 4#  2x 10,  Seated horizontal ABD 2# x 10 Seated lateral raises  2# x10, 3# x 10 OH press 3# x20 reps  Long arc quads x 20, marching and ER with 4# added to ankles 2x10   06/10/24: NuStep: level 5x 10 minutes- PT present to discuss progress Sit to stand 1x 10, then Sit to stand with slow descent x 10 Seated L soleus stretch on leg of walker 2 x 30 sec Seated trunk ext and diagonals x 10 ea 5# kettlebell and following with eyes Seated hip to hip, V's (shoulder/waist/shoulder), ear to ear 5# kettlebell: x10 Steam engines 3 # weights 2x10 Biceps curl 4# x 20,  Seated horizontal ABD 2# x 10 Seated lateral raises  2# x10, 3# x 10 OH press 3# x20 reps   335 ft with RPE of 6/10  06/08/24: NuStep: level 5x 10 minutes- PT present to discuss progress Sit to stand 1x 10, then Sit to stand with slow descent x 10 Seated trunk ext and diagonals x 10 ea 5# kettlebell and following with eyes Seated hip to hip, V's (shoulder/waist/shoulder), ear to ear 5# kettlebell: x10 Steam engines 3 # weights 2x10 Biceps curl 4# x 20,  Seated horizontal ABD 2# x 10 Seated lateral raises 1# x 10 - easy then did 2# OH press 3# x20 reps  Seated lumbar ext with magenta powercord x 10 Long arc quads x 20, marching and ER with 4# added to ankles 2x10   PATIENT EDUCATION:  Education details: [email or web address], HEP update Person educated: Patient Education method: Explanation, Demonstration, and Handouts Education comprehension: verbalized understanding and returned demonstration  HOME EXERCISE PROGRAM: Access Code: [email or web address] URL: [email or web address] Date: 03/23/2024 Prepared by: Burnard  Exercises - Supine Hip Internal and External Rotation  - 1 x daily - 7 x weekly - 3 sets - 10 reps - Seated Scapular Retraction  - 5 x daily - 7 x weekly - 1 sets - 10 reps - 5 hold - Seated Thoracic Flexion and Rotation with Arms Crossed  - 3 x daily - 7 x weekly - 1 sets - 3 reps - 10 hold - Seated Correct Posture   - 1 x daily - 7 x weekly - 3 sets - 10 reps - Correct Standing Posture  - 1 x daily - 7 x weekly - 3 sets - 10 reps - Seated Hamstring Stretch  - 3 x daily - 7 x weekly - 1 sets - 3 reps - 20 hold - Supine Lower Trunk Rotation  - 3 x daily - 7 x weekly - 1 sets - 3 reps - 20 hold - Seated Thoracic Extension Arms Overhead  - 1 x daily - 7 x weekly - 1-2 sets - 10 reps - 2-3 sec hold - Seated Trunk Rotation  - 1 x daily - 7 x weekly - 1-2 sets - 10 reps - 2-3 sec hold - Seated Diagonal Chops with Medicine Ball  - 1 x daily - 7 x weekly - 1-2 sets - 10 reps - 2-3 hold - Seated Bilateral Shoulder External Rotation with Resistance  - 1 x daily - 7 x weekly - 2 sets - 10 reps - Seated Shoulder Horizontal Abduction with Resistance  - 1 x daily - 7 x weekly - 2 sets - 10 reps ASSESSMENT:  CLINICAL IMPRESSION: Patient reporting she had a spasm Wednesday night. She was able to calm it down fairly quickly. No known activity  set it off. She was able to increase weight today with core strengthening and steam engines. Her distance was considerably lower today, however she was talking the entire time which may have slowed her down. Her RPE was less accordingly.    OBJECTIVE IMPAIRMENTS: Abnormal gait, decreased activity tolerance, decreased balance, decreased mobility, difficulty walking, decreased strength, decreased safety awareness, hypomobility, increased muscle spasms, impaired flexibility, improper body mechanics, postural dysfunction, and pain.   ACTIVITY LIMITATIONS: carrying, lifting, standing, squatting, stairs, transfers, and locomotion level  PARTICIPATION LIMITATIONS: meal prep, cleaning, laundry, driving, community activity, and church  PERSONAL FACTORS: Age, Time since onset of injury/illness/exacerbation, and 1-2 comorbidities: Lt hip OA, chronic gait abnormality are also affecting patient's functional outcome.   REHAB POTENTIAL: Good  CLINICAL DECISION MAKING: Evolving/moderate  complexity  EVALUATION COMPLEXITY: Moderate   GOALS: Goals reviewed with patient? Yes  SHORT TERM GOALS: Target date: 05/25/2024      Be independent in initial HEP Baseline: Goal status: MET  2.  Ambulate 400 feet with 3 min walk test Baseline: 390 feet with RPE 8-9/10 (04/27/24) Goal status: NEW  3.  Report > or = to 60% reduction in LBP/thoracic pain with ADLs and self-care Baseline: 80% reduction in frequency (06/01/24) Goal status: MET  4.  Improve LE strength to perform stand to sit with controlled descent x 5 reps  Baseline: able to do this with controlled descent, on toe of Rt foot (04/06/24 Goal status: MET  5.  Report postural corrections at home to reduce lumbar/thoracic stress with standing Baseline: has been using walker to assist, still very challenged with this (05/04/24) Goal status: In progress    LONG TERM GOALS: Target date: 06/29/2024      Be independent in advanced HEP Baseline:  Goal status: in progress   2.  Ambulate 425 feet with 3 min walk test Baseline:310 feet Goal status: IN PROGRESS 06/10/24 335 ft 6/10  3.  Report > or = to 70% reduction in LBP/thoracic pain with ADLs and self-care Baseline: 80% reduction in frequency (06/01/24) Goal status: MET  4.  Perform 5x sit to stand in < or = to 15 seconds with min UE support to reduce falls risk Baseline: 15.85 seconds  (06/01/24) Goal status: MET 11.89 sec  5.  Improve endurance to perform 3 min walk test with RPE < or = to 5/10 Baseline: 8-9/10 Goal status: IN PROGRESS  06/10/24 335 ft 6/10   PLAN:  PT FREQUENCY: 2x/week  PT DURATION: 8 weeks  PLANNED INTERVENTIONS: 97110-Therapeutic exercises, 97530- Therapeutic activity, 97112- Neuromuscular re-education, 97535- Self Care, 02859- Manual therapy, [phone]- Gait training, [phone]- Canalith repositioning, [id number]- Aquatic Therapy, [phone]- Electrical stimulation (unattended), [phone]- Electrical stimulation (manual), Patient/Family education, Stair  training, Taping, Dry Needling, Joint mobilization, Spinal manipulation, Spinal mobilization, Vestibular training, Cryotherapy, and Moist heat.  PLAN FOR NEXT SESSION: advance resistance/reps as needed, continue walking endurance, work on posture, sit to stand, lumbar and hip flexibility.    Mliss Cummins, PT  06/17/24 11:51 AM  [facility] [address] [place], [place] [address] Phone # [phone] Fax [phone]

## 2024-06-17 ENCOUNTER — Ambulatory Visit: Attending: Internal Medicine | Admitting: Physical Therapy

## 2024-06-17 ENCOUNTER — Encounter: Payer: Self-pay | Admitting: Physical Therapy

## 2024-06-17 DIAGNOSIS — R252 Cramp and spasm: Secondary | ICD-10-CM | POA: Diagnosis not present

## 2024-06-17 DIAGNOSIS — M6281 Muscle weakness (generalized): Secondary | ICD-10-CM | POA: Insufficient documentation

## 2024-06-17 DIAGNOSIS — M546 Pain in thoracic spine: Secondary | ICD-10-CM | POA: Diagnosis not present

## 2024-06-17 DIAGNOSIS — R2689 Other abnormalities of gait and mobility: Secondary | ICD-10-CM | POA: Diagnosis not present

## 2024-06-17 DIAGNOSIS — M5459 Other low back pain: Secondary | ICD-10-CM | POA: Diagnosis not present

## 2024-06-21 DIAGNOSIS — H4311 Vitreous hemorrhage, right eye: Secondary | ICD-10-CM | POA: Diagnosis not present

## 2024-06-21 DIAGNOSIS — H3581 Retinal edema: Secondary | ICD-10-CM | POA: Diagnosis not present

## 2024-06-21 DIAGNOSIS — H33302 Unspecified retinal break, left eye: Secondary | ICD-10-CM | POA: Diagnosis not present

## 2024-06-21 DIAGNOSIS — Z961 Presence of intraocular lens: Secondary | ICD-10-CM | POA: Diagnosis not present

## 2024-06-21 NOTE — Therapy (Signed)
 OUTPATIENT PHYSICAL THERAPY TREATMENT  Patient Name: Kerry Wright MRN: [id number] DOB:Sep 29, 1941, 83 y.o., female Today's Date: 06/22/2024    END OF SESSION:  PT End of Session - 06/22/24 1010     Visit Number 26    Date for PT Re-Evaluation 06/29/24    Authorization Type [employer] Medicare    Progress Note Due on Visit 30    PT Start Time 1018    PT Stop Time 1100    PT Time Calculation (min) 42 min    Activity Tolerance Patient tolerated treatment well    Behavior During Therapy WFL for tasks assessed/performed                    Past Medical History:  Diagnosis Date   Complication of anesthesia    Headache(784.0)    hx. migraines - less occurring now.   History of kidney stones    remains with kidney stones(not a problem at this time)   Hypertension    Hyperthyroidism    hx. Graves disease 'tx.with radioactive iodine   Osteoarthritis    PONV (postoperative nausea and vomiting)    Past Surgical History:  Procedure Laterality Date   COLONOSCOPY WITH PROPOFOL  N/A 07/31/2014   Procedure: COLONOSCOPY WITH PROPOFOL ;  Surgeon: Gladis MARLA Louder, MD;  Location: [facility];  Service: Endoscopy;  Laterality: N/A;   TONSILLECTOMY     child   There are no active problems to display for this patient.   REFERRING PROVIDER/PCP: Dwight Ave, MD   REFERRING DIAG: M54.9 (ICD-10-CM) - Back pain, Rt sided thoracic pain with muscle spasms  Rationale for Evaluation and Treatment: Rehabilitation  THERAPY DIAG:  Pain in thoracic spine  Other low back pain  Other abnormalities of gait and mobility  Muscle weakness (generalized)  Cramp and spasm  ONSET DATE: 4 month history   SUBJECTIVE:                                                                                                                                                                                           SUBJECTIVE STATEMENT: It's an okay day. No spasms since last visit. Some discomfort, but pain  patch helps. 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 They did not give her any restrictions.  From Evaluation: Pt present to PT with Rt>Lt thoracic pain and spasms that began 10/2023. MD has prescribed muscle relaxers and is not sure if it is helping. She walks with a quad cane with significant trunk flexion.     PERTINENT HISTORY:  HTN, Rt hip OA (needs THA), migraines   PAIN: 06/08/24 Are you having  pain? Yes: NPRS scale: 0/10 Pain location: bil thoracic and low back  Pain description: shooting, aching  Aggravating factors: early morning, bending over to reach, sometimes just sitting still , random  Relieving factors: pain patch, Aleve, Excedrin  PRECAUTIONS: Fall  RED FLAGS: None   WEIGHT BEARING RESTRICTIONS: No  FALLS:  Has patient fallen in last 6 months? No  LIVING ENVIRONMENT: Lives with: lives alone Lives in: House/apartment Stairs: Yes: Internal: 12 steps; on right going uplives on main level, doesn't need to go up steps  Has following equipment at home: Chiropractor, Environmental consultant - 4 wheeled, and shower chair  OCCUPATION: retired   PLOF: Independent with community mobility without device and Leisure: watches TV, reading, crossword puzzles, uses hand weights for exercise  PATIENT GOALS: reduce back pain, improve stability   NEXT MD VISIT: May 2025  OBJECTIVE:  Note: Objective measures were completed at Evaluation unless otherwise noted.  DIAGNOSTIC FINDINGS:  CT 03/28/24 IMPRESSION: 1. Thoracic spondylosis and degenerative disc disease, with bridging anterior interbody spurring all levels between T7 and T12, and non bridging anterior interbody spurring at T5-6. 2. Mild thoracic kyphosis and dextroconvex thoracic scoliosis. 3.  Aortic Atherosclerosis (ICD10-I70.0).  Coronary atherosclerosis. 4. Suspected cholelithiasis. 5. Prominence of stool throughout the colon compatible with constipation.  PATIENT SURVEYS:  03/07/24: Modified Oswestry  18/45 =40% disability  04/22/24   Modified Oswestry 9 / 50 = 18.0 %  COGNITION: Overall cognitive status: Within functional limits for tasks assessed     SENSATION: WFL   POSTURE: rounded shoulders, forward head, flexed trunk , and weight shift left  PALPATION: Palpable tenderness over Lt>Rt thoracic paraspinals, Lt rhomboids and lumbar paraspinals   LOWER EXTREMITY MMT:    Rt hip rests in ER  4/5, Lt hip 4+/5, bil knees 4+/5   FUNCTIONAL TESTS:  04/06/24: 5x sit to stand: 20.3 seconds with controlled descent  3 min walk test: 255 feet with 7/10 RPE  03/08/24: 5 times sit to stand: 20.02 seconds with uncontrolled descent 03/16/24: 3 min walk test 264 feet with 8/10 RPE  04/22/24 3 min walk test 390 ft with 8-9/10 RPE  5XSTS 19.16 sec with controlled descent  05/04/24: 5x sit to stand 20 seconds   06/01/24 3 min walk test 357 ft with 7/10 RPE  5XSTS 15.85 sec  with controlled descent  06/10/24  3 min walk 335 ft 6/10  06/15/24 5XSTS 11.89 sec  06/17/24  292 ft 5/10  GAIT: Distance walked: 50 Assistive device utilized: Quad cane large base Level of assistance: Modified independence Comments: ER at Rt hip, flexed trunk  TREATMENT DATE:  06/22/24: NuStep: level 5x 10 minutes- PT present to discuss progress Sit to stand 1x10, 1 x 10  slower descent  Seated L soleus stretch on leg of walker 2 x 30 sec Seated trunk ext and diagonals x 10 ea 5# kettlebell and following with eyes  Seated 8# hip to hip with , V's (shoulder/waist/shoulder) , 5# ear to ear: x10 8# ab crunch in sitting  Steam engines 4 # weights 2x10 Biceps curl  to [facility] press 4# 2x 10,  Seated horizontal ABD RTB  x 10 Seated ER with retraction with RTB x 10 Seated lateral raises , 3# 1 x 10 nad 1x 5 Long arc quads x 20, marching and ER with 4# added to ankles 2x10 HS curls 4# x 10 B  06/17/24: NuStep: level 5x 10 minutes- PT present to discuss progress Sit to stand 1x10, 1  x 5  slower descent  Seated L soleus  stretch on leg of walker 2 x 30 sec Seated trunk ext and diagonals x 10 ea 5# kettlebell and following with eyes  Seated hip to hip with , V's (shoulder/waist/shoulder), ear to ear 8# wt: x10 Steam engines 4 # weights 2x10 Biceps curl 4# 2x 10,  Seated horizontal ABD RTB  x 10 Seated lateral raises , 3# 2 x 10 OH press 3# x20 reps  292 ft RPE 5/10  06/15/24: NuStep: level 5x 10 minutes- PT present to discuss progress Sit to stand 1x 5, 1x10 Seated L soleus stretch on leg of walker 2 x 30 sec Seated trunk ext and diagonals x 10 ea 5# kettlebell and following with eyes  Seated hip to hip, V's (shoulder/waist/shoulder), ear to ear 5# kettlebell: 2x10 Steam engines 3 # weights 2x10 Biceps curl 4# 2x 10,  Seated horizontal ABD 2# x 10 Seated lateral raises  2# x10, 3# x 10 OH press 3# x20 reps  Long arc quads x 20, marching and ER with 4# added to ankles 2x10    PATIENT EDUCATION:  Education details: [email or web address], HEP update Person educated: Patient Education method: Explanation, Demonstration, and Handouts Education comprehension: verbalized understanding and returned demonstration  HOME EXERCISE PROGRAM: Access Code: [email or web address] URL: [email or web address] Date: 03/23/2024 Prepared by: Burnard  Exercises - Supine Hip Internal and External Rotation  - 1 x daily - 7 x weekly - 3 sets - 10 reps - Seated Scapular Retraction  - 5 x daily - 7 x weekly - 1 sets - 10 reps - 5 hold - Seated Thoracic Flexion and Rotation with Arms Crossed  - 3 x daily - 7 x weekly - 1 sets - 3 reps - 10 hold - Seated Correct Posture  - 1 x daily - 7 x weekly - 3 sets - 10 reps - Correct Standing Posture  - 1 x daily - 7 x weekly - 3 sets - 10 reps - Seated Hamstring Stretch  - 3 x daily - 7 x weekly - 1 sets - 3 reps - 20 hold - Supine Lower Trunk Rotation  - 3 x daily - 7 x weekly - 1 sets - 3 reps - 20 hold - Seated Thoracic Extension Arms Overhead  - 1 x daily - 7 x weekly - 1-2 sets - 10  reps - 2-3 sec hold - Seated Trunk Rotation  - 1 x daily - 7 x weekly - 1-2 sets - 10 reps - 2-3 sec hold - Seated Diagonal Chops with Medicine Ball  - 1 x daily - 7 x weekly - 1-2 sets - 10 reps - 2-3 hold - Seated Bilateral Shoulder External Rotation with Resistance  - 1 x daily - 7 x weekly - 2 sets - 10 reps - Seated Shoulder Horizontal Abduction with Resistance  - 1 x daily - 7 x weekly - 2 sets - 10 reps ASSESSMENT:  CLINICAL IMPRESSION: Patient able to add in standing HS curls today fairly easily. We just did one set to assess HS response. Lateral raises are most challenging and OH lifts. No spasms since last visit. Good control with eccentric sit to stands and able to do more reps today. Work on walking next visit.    OBJECTIVE IMPAIRMENTS: Abnormal gait, decreased activity tolerance, decreased balance, decreased mobility, difficulty walking, decreased strength, decreased safety awareness, hypomobility, increased muscle spasms, impaired flexibility, improper body mechanics, postural dysfunction, and pain.   ACTIVITY LIMITATIONS:  carrying, lifting, standing, squatting, stairs, transfers, and locomotion level  PARTICIPATION LIMITATIONS: meal prep, cleaning, laundry, driving, community activity, and church  PERSONAL FACTORS: Age, Time since onset of injury/illness/exacerbation, and 1-2 comorbidities: Lt hip OA, chronic gait abnormality are also affecting patient's functional outcome.   REHAB POTENTIAL: Good  CLINICAL DECISION MAKING: Evolving/moderate complexity  EVALUATION COMPLEXITY: Moderate   GOALS: Goals reviewed with patient? Yes  SHORT TERM GOALS: Target date: 05/25/2024      Be independent in initial HEP Baseline: Goal status: MET  2.  Ambulate 400 feet with 3 min walk test Baseline: 390 feet with RPE 8-9/10 (04/27/24) Goal status: NEW  3.  Report > or = to 60% reduction in LBP/thoracic pain with ADLs and self-care Baseline: 80% reduction in frequency  (06/01/24) Goal status: MET  4.  Improve LE strength to perform stand to sit with controlled descent x 5 reps  Baseline: able to do this with controlled descent, on toe of Rt foot (04/06/24 Goal status: MET  5.  Report postural corrections at home to reduce lumbar/thoracic stress with standing Baseline: has been using walker to assist, still very challenged with this (05/04/24) Goal status: In progress    LONG TERM GOALS: Target date: 06/29/2024      Be independent in advanced HEP Baseline:  Goal status: in progress   2.  Ambulate 425 feet with 3 min walk test Baseline:310 feet Goal status: IN PROGRESS 06/10/24 335 ft 6/10  3.  Report > or = to 70% reduction in LBP/thoracic pain with ADLs and self-care Baseline: 80% reduction in frequency (06/01/24) Goal status: MET  4.  Perform 5x sit to stand in < or = to 15 seconds with min UE support to reduce falls risk Baseline: 15.85 seconds  (06/01/24) Goal status: MET 11.89 sec  5.  Improve endurance to perform 3 min walk test with RPE < or = to 5/10 Baseline: 8-9/10 Goal status: IN PROGRESS  06/10/24 335 ft 6/10   PLAN:  PT FREQUENCY: 2x/week  PT DURATION: 8 weeks  PLANNED INTERVENTIONS: 97110-Therapeutic exercises, 97530- Therapeutic activity, 97112- Neuromuscular re-education, 97535- Self Care, 02859- Manual therapy, [phone]- Gait training, [phone]- Canalith repositioning, [id number]- Aquatic Therapy, [phone]- Electrical stimulation (unattended), [phone]- Electrical stimulation (manual), Patient/Family education, Stair training, Taping, Dry Needling, Joint mobilization, Spinal manipulation, Spinal mobilization, Vestibular training, Cryotherapy, and Moist heat.  PLAN FOR NEXT SESSION: advance resistance/reps as needed, continue walking endurance, work on posture, sit to stand, lumbar and hip flexibility.    Mliss Cummins, PT  06/22/24 11:02 AM  [facility] [address], [address] [place], [place] [address] Phone #  [phone] Fax [phone]

## 2024-06-22 ENCOUNTER — Ambulatory Visit: Admitting: Physical Therapy

## 2024-06-22 ENCOUNTER — Encounter: Payer: Self-pay | Admitting: Physical Therapy

## 2024-06-22 DIAGNOSIS — R2689 Other abnormalities of gait and mobility: Secondary | ICD-10-CM

## 2024-06-22 DIAGNOSIS — R252 Cramp and spasm: Secondary | ICD-10-CM | POA: Diagnosis not present

## 2024-06-22 DIAGNOSIS — M5459 Other low back pain: Secondary | ICD-10-CM | POA: Diagnosis not present

## 2024-06-22 DIAGNOSIS — M6281 Muscle weakness (generalized): Secondary | ICD-10-CM

## 2024-06-22 DIAGNOSIS — M546 Pain in thoracic spine: Secondary | ICD-10-CM

## 2024-06-23 NOTE — Therapy (Signed)
 OUTPATIENT PHYSICAL THERAPY TREATMENT  Patient Name: Kerry Wright MRN: [id number] DOB:[birth year], 83 y.o., female Today's Date: 06/24/2024    END OF SESSION:  PT End of Session - 06/24/24 1016     Visit Number 27    Date for PT Re-Evaluation 06/29/24    Authorization Type [employer]    Progress Note Due on Visit 30    PT Start Time 1013    PT Stop Time 1058    PT Time Calculation (min) 45 min    Activity Tolerance Patient tolerated treatment well    Behavior During Therapy WFL for tasks assessed/performed                     Past Medical History:  Diagnosis Date   Complication of anesthesia    Headache(784.0)    hx. migraines - less occurring now.   History of kidney stones    remains with kidney stones(not a problem at this time)   Hypertension    Hyperthyroidism    hx. Graves disease 'tx.with radioactive iodine   Osteoarthritis    PONV (postoperative nausea and vomiting)    Past Surgical History:  Procedure Laterality Date   COLONOSCOPY WITH PROPOFOL  N/A 07/31/2014   Procedure: COLONOSCOPY WITH PROPOFOL ;  Surgeon: Gladis MARLA Louder, MD;  Location: [facility];  Service: Endoscopy;  Laterality: N/A;   TONSILLECTOMY     child   There are no active problems to display for this patient.   REFERRING PROVIDER/PCP: Dwight Ave, MD   REFERRING DIAG: M54.9 (ICD-10-CM) - Back pain, Rt sided thoracic pain with muscle spasms  Rationale for Evaluation and Treatment: Rehabilitation  THERAPY DIAG:  Pain in thoracic spine  Other low back pain  Other abnormalities of gait and mobility  Muscle weakness (generalized)  Cramp and spasm  ONSET DATE: 4 month history   SUBJECTIVE:                                                                                                                                                                                           SUBJECTIVE STATEMENT: I had spasms last night and had to take a muscle relaxor. No pain  today. I had been doing a lot of bending with taking out the trash and such.   From Evaluation: Pt present to PT with Rt>Lt thoracic pain and spasms that began 10/2023. MD has prescribed muscle relaxers and is not sure if it is helping. She walks with a quad cane with significant trunk flexion.     PERTINENT HISTORY:  HTN, Rt hip OA (needs THA), migraines   PAIN: 06/08/24 Are you  having pain? Yes: NPRS scale: 0/10 Pain location: bil thoracic and low back  Pain description: shooting, aching  Aggravating factors: early morning, bending over to reach, sometimes just sitting still , random  Relieving factors: pain patch, Aleve, Excedrin  PRECAUTIONS: Fall  RED FLAGS: None   WEIGHT BEARING RESTRICTIONS: No  FALLS:  Has patient fallen in last 6 months? No  LIVING ENVIRONMENT: Lives with: lives alone Lives in: House/apartment Stairs: Yes: Internal: 12 steps; on right going uplives on main level, doesn't need to go up steps  Has following equipment at home: Chiropractor, Environmental consultant - 4 wheeled, and shower chair  OCCUPATION: retired   PLOF: Independent with community mobility without device and Leisure: watches TV, reading, crossword puzzles, uses hand weights for exercise  PATIENT GOALS: reduce back pain, improve stability   NEXT MD VISIT: May 2025  OBJECTIVE:  Note: Objective measures were completed at Evaluation unless otherwise noted.  DIAGNOSTIC FINDINGS:  CT 03/28/24 IMPRESSION: 1. Thoracic spondylosis and degenerative disc disease, with bridging anterior interbody spurring all levels between T7 and T12, and non bridging anterior interbody spurring at T5-6. 2. Mild thoracic kyphosis and dextroconvex thoracic scoliosis. 3.  Aortic Atherosclerosis (ICD10-I70.0).  Coronary atherosclerosis. 4. Suspected cholelithiasis. 5. Prominence of stool throughout the colon compatible with constipation.  PATIENT SURVEYS:  03/07/24: Modified Oswestry 18/45 =40% disability  04/22/24    Modified Oswestry 9 / 50 = 18.0 %  COGNITION: Overall cognitive status: Within functional limits for tasks assessed     SENSATION: WFL   POSTURE: rounded shoulders, forward head, flexed trunk , and weight shift left  PALPATION: Palpable tenderness over Lt>Rt thoracic paraspinals, Lt rhomboids and lumbar paraspinals   LOWER EXTREMITY MMT:    Rt hip rests in ER  4/5, Lt hip 4+/5, bil knees 4+/5   FUNCTIONAL TESTS:  04/06/24: 5x sit to stand: 20.3 seconds with controlled descent  3 min walk test: 255 feet with 7/10 RPE  03/08/24: 5 times sit to stand: 20.02 seconds with uncontrolled descent 03/16/24: 3 min walk test 264 feet with 8/10 RPE  04/22/24 3 min walk test 390 ft with 8-9/10 RPE  5XSTS 19.16 sec with controlled descent  05/04/24: 5x sit to stand 20 seconds   06/01/24 3 min walk test 357 ft with 7/10 RPE  5XSTS 15.85 sec  with controlled descent  06/10/24  3 min walk 335 ft 6/10 1.86 ft/sec  06/15/24 5XSTS 11.89 sec  06/17/24  292 ft 5/10  06/24/24  275 ft in 2 min 17 sec  69ft/sec  GAIT: Distance walked: 50 Assistive device utilized: Quad cane large base Level of assistance: Modified independence Comments: ER at Rt hip, flexed trunk  TREATMENT DATE:   06/24/24:  NuStep: level 5x 10 minutes- PT present to discuss progress Sit to stand 1x10 with slower descent  Seated trunk ext and diagonals x 10 ea 6# wt and following with eyes  Seated 8# hip to hip with , V's (shoulder/waist/shoulder) , 6# ear to ear: x10 8# ab crunch in sitting x 10 Steam engines 4 # weights 2x10 Lat pull down yellow x 10 PT standing on mat Shoulder ext yellow x 10 PT holding in front and OH Biceps curl  to [facility] press 4# 2x 10,  Gait: 275 ft in 2 min 17 sec  30ft/sec Long arc quads x 20, marching (no wt on R today) x 15and ER with 4# added to L ankle only x 10  HS curls 4# x 10  B  06/22/24: NuStep: level 5x 10 minutes- PT present to discuss progress Sit to stand 1x10, 1 x 10  slower descent   Seated L soleus stretch on leg of walker 2 x 30 sec Seated trunk ext and diagonals x 10 ea 5# kettlebell and following with eyes  Seated 8# hip to hip with , V's (shoulder/waist/shoulder) , 5# ear to ear: x10 8# ab crunch in sitting  Steam engines 4 # weights 2x10 Biceps curl  to [facility] press 4# 2x 10,  Seated horizontal ABD RTB  x 10 Seated ER with retraction with RTB x 10 Seated lateral raises , 3# 1 x 10 nad 1x 5 Long arc quads x 20, marching and ER with 4# added to ankles 2x10 HS curls 4# x 10 B  06/17/24: NuStep: level 5x 10 minutes- PT present to discuss progress Sit to stand 1x10, 1 x 5  slower descent  Seated L soleus stretch on leg of walker 2 x 30 sec Seated trunk ext and diagonals x 10 ea 5# kettlebell and following with eyes  Seated hip to hip with , V's (shoulder/waist/shoulder), ear to ear 8# wt: x10 Steam engines 4 # weights 2x10 Biceps curl 4# 2x 10,  Seated horizontal ABD RTB  x 10 Seated lateral raises , 3# 2 x 10 OH press 3# x20 reps  292 ft RPE 5/10  06/15/24: NuStep: level 5x 10 minutes- PT present to discuss progress Sit to stand 1x 5, 1x10 Seated L soleus stretch on leg of walker 2 x 30 sec Seated trunk ext and diagonals x 10 ea 5# kettlebell and following with eyes  Seated hip to hip, V's (shoulder/waist/shoulder), ear to ear 5# kettlebell: 2x10 Steam engines 3 # weights 2x10 Biceps curl 4# 2x 10,  Seated horizontal ABD 2# x 10 Seated lateral raises  2# x10, 3# x 10 OH press 3# x20 reps  Long arc quads x 20, marching and ER with 4# added to ankles 2x10    PATIENT EDUCATION:  Education details: [email or web address], HEP update Person educated: Patient Education method: Explanation, Demonstration, and Handouts Education comprehension: verbalized understanding and returned demonstration  HOME EXERCISE PROGRAM: Access Code: [email or web address] URL: [email or web address] Date: 03/23/2024 Prepared by: Burnard  Exercises - Supine Hip Internal and External  Rotation  - 1 x daily - 7 x weekly - 3 sets - 10 reps - Seated Scapular Retraction  - 5 x daily - 7 x weekly - 1 sets - 10 reps - 5 hold - Seated Thoracic Flexion and Rotation with Arms Crossed  - 3 x daily - 7 x weekly - 1 sets - 3 reps - 10 hold - Seated Correct Posture  - 1 x daily - 7 x weekly - 3 sets - 10 reps - Correct Standing Posture  - 1 x daily - 7 x weekly - 3 sets - 10 reps - Seated Hamstring Stretch  - 3 x daily - 7 x weekly - 1 sets - 3 reps - 20 hold - Supine Lower Trunk Rotation  - 3 x daily - 7 x weekly - 1 sets - 3 reps - 20 hold - Seated Thoracic Extension Arms Overhead  - 1 x daily - 7 x weekly - 1-2 sets - 10 reps - 2-3 sec hold - Seated Trunk Rotation  - 1 x daily - 7 x weekly - 1-2 sets - 10 reps - 2-3 sec hold - Seated Diagonal Chops with Medicine Ball  - 1 x daily -  7 x weekly - 1-2 sets - 10 reps - 2-3 hold - Seated Bilateral Shoulder External Rotation with Resistance  - 1 x daily - 7 x weekly - 2 sets - 10 reps - Seated Shoulder Horizontal Abduction with Resistance  - 1 x daily - 7 x weekly - 2 sets - 10 reps ASSESSMENT:  CLINICAL IMPRESSION: Patient reporting increased spasm in back yesterday after doing a lot of bending earlier in the day. Today she had no pain but was somewhat fatigued compared to previous visits. Weight had to be reduced on R LE today due to fatigue and some hip discomfort. Gait was limited to 2 min today as well. We discussed increasing Nustep resistance next visit and assessing gait prior to Nustep in the future.     OBJECTIVE IMPAIRMENTS: Abnormal gait, decreased activity tolerance, decreased balance, decreased mobility, difficulty walking, decreased strength, decreased safety awareness, hypomobility, increased muscle spasms, impaired flexibility, improper body mechanics, postural dysfunction, and pain.   ACTIVITY LIMITATIONS: carrying, lifting, standing, squatting, stairs, transfers, and locomotion level  PARTICIPATION LIMITATIONS: meal prep,  cleaning, laundry, driving, community activity, and church  PERSONAL FACTORS: Age, Time since onset of injury/illness/exacerbation, and 1-2 comorbidities: Lt hip OA, chronic gait abnormality are also affecting patient's functional outcome.   REHAB POTENTIAL: Good  CLINICAL DECISION MAKING: Evolving/moderate complexity  EVALUATION COMPLEXITY: Moderate   GOALS: Goals reviewed with patient? Yes  SHORT TERM GOALS: Target date: 05/25/2024      Be independent in initial HEP Baseline: Goal status: MET  2.  Ambulate 400 feet with 3 min walk test Baseline: 390 feet with RPE 8-9/10 (04/27/24) Goal status: NEW  3.  Report > or = to 60% reduction in LBP/thoracic pain with ADLs and self-care Baseline: 80% reduction in frequency (06/01/24) Goal status: MET  4.  Improve LE strength to perform stand to sit with controlled descent x 5 reps  Baseline: able to do this with controlled descent, on toe of Rt foot (04/06/24 Goal status: MET  5.  Report postural corrections at home to reduce lumbar/thoracic stress with standing Baseline: has been using walker to assist, still very challenged with this (05/04/24) Goal status: In progress    LONG TERM GOALS: Target date: 06/29/2024      Be independent in advanced HEP Baseline:  Goal status: in progress   2.  Ambulate 425 feet with 3 min walk test Baseline:310 feet Goal status: IN PROGRESS 06/10/24 335 ft 6/10  3.  Report > or = to 70% reduction in LBP/thoracic pain with ADLs and self-care Baseline: 80% reduction in frequency (06/01/24) Goal status: MET  4.  Perform 5x sit to stand in < or = to 15 seconds with min UE support to reduce falls risk Baseline: 15.85 seconds  (06/01/24) Goal status: MET 11.89 sec  5.  Improve endurance to perform 3 min walk test with RPE < or = to 5/10 Baseline: 8-9/10 Goal status: IN PROGRESS  06/10/24 335 ft 6/10   PLAN:  PT FREQUENCY: 2x/week  PT DURATION: 8 weeks  PLANNED INTERVENTIONS:  97110-Therapeutic exercises, 97530- Therapeutic activity, 97112- Neuromuscular re-education, 97535- Self Care, [id number]- Manual therapy, [phone]- Gait training, [phone]- Canalith repositioning, [id number]- Aquatic Therapy, [phone]- Electrical stimulation (unattended), [phone]- Electrical stimulation (manual), Patient/Family education, Stair training, Taping, Dry Needling, Joint mobilization, Spinal manipulation, Spinal mobilization, Vestibular training, Cryotherapy, and Moist heat.  PLAN FOR NEXT SESSION: advance resistance/reps as needed, continue walking endurance, work on posture, sit to stand, lumbar and hip flexibility.  Mliss Cummins, PT  06/24/24 12:03 PM  [facility] [address] [place], [place] [address] Phone # [phone] Fax [phone]

## 2024-06-24 ENCOUNTER — Ambulatory Visit: Admitting: Physical Therapy

## 2024-06-24 ENCOUNTER — Encounter: Payer: Self-pay | Admitting: Physical Therapy

## 2024-06-24 DIAGNOSIS — R252 Cramp and spasm: Secondary | ICD-10-CM | POA: Diagnosis not present

## 2024-06-24 DIAGNOSIS — M6281 Muscle weakness (generalized): Secondary | ICD-10-CM

## 2024-06-24 DIAGNOSIS — M5459 Other low back pain: Secondary | ICD-10-CM | POA: Diagnosis not present

## 2024-06-24 DIAGNOSIS — M546 Pain in thoracic spine: Secondary | ICD-10-CM | POA: Diagnosis not present

## 2024-06-24 DIAGNOSIS — R2689 Other abnormalities of gait and mobility: Secondary | ICD-10-CM

## 2024-06-29 ENCOUNTER — Ambulatory Visit

## 2024-06-29 DIAGNOSIS — M546 Pain in thoracic spine: Secondary | ICD-10-CM | POA: Diagnosis not present

## 2024-06-29 DIAGNOSIS — R252 Cramp and spasm: Secondary | ICD-10-CM

## 2024-06-29 DIAGNOSIS — R2689 Other abnormalities of gait and mobility: Secondary | ICD-10-CM | POA: Diagnosis not present

## 2024-06-29 DIAGNOSIS — M6281 Muscle weakness (generalized): Secondary | ICD-10-CM

## 2024-06-29 DIAGNOSIS — M5459 Other low back pain: Secondary | ICD-10-CM

## 2024-06-29 NOTE — Therapy (Signed)
 OUTPATIENT PHYSICAL THERAPY DISCHARGE Patient Name: Kerry Wright MRN: [id number] DOB:[birth year], 83 y.o., female Today's Date: 06/29/2024    END OF SESSION:  PT End of Session - 06/29/24 1103     Visit Number 28    Date for PT Re-Evaluation 06/29/24    Authorization Type [employer]    Progress Note Due on Visit 30    PT Start Time 1000    PT Stop Time 1045    PT Time Calculation (min) 45 min    Activity Tolerance Patient tolerated treatment well    Behavior During Therapy WFL for tasks assessed/performed              Past Medical History:  Diagnosis Date   Complication of anesthesia    Headache(784.0)    hx. migraines - less occurring now.   History of kidney stones    remains with kidney stones(not a problem at this time)   Hypertension    Hyperthyroidism    hx. Graves disease 'tx.with radioactive iodine   Osteoarthritis    PONV (postoperative nausea and vomiting)    Past Surgical History:  Procedure Laterality Date   COLONOSCOPY WITH PROPOFOL  N/A 07/31/2014   Procedure: COLONOSCOPY WITH PROPOFOL ;  Surgeon: Gladis MARLA Louder, MD;  Location: [facility];  Service: Endoscopy;  Laterality: N/A;   TONSILLECTOMY     child   There are no active problems to display for this patient.   REFERRING PROVIDER/PCP: Dwight Ave, MD   REFERRING DIAG: M54.9 (ICD-10-CM) - Back pain, Rt sided thoracic pain with muscle spasms  Rationale for Evaluation and Treatment: Rehabilitation  THERAPY DIAG:  Pain in thoracic spine  Muscle weakness (generalized)  Other low back pain  Cramp and spasm  Other abnormalities of gait and mobility  ONSET DATE: 4 month history   SUBJECTIVE:                                                                                                                                                                                           SUBJECTIVE STATEMENT: Pt reports no pain however had back spasms last night, taking Aleve and a muscle  relaxer in order to go back to sleep. She states this was the first spasm she's had in a long time.   From Evaluation: Pt present to PT with Rt>Lt thoracic pain and spasms that began 10/2023. MD has prescribed muscle relaxers and is not sure if it is helping. She walks with a quad cane with significant trunk flexion.     PERTINENT HISTORY:  HTN, Rt hip OA (needs THA), migraines   PAIN: 06/28/24 Are you having pain?  Yes: NPRS scale: 0/10 Pain location: bil thoracic and low back  Pain description: shooting, aching  Aggravating factors: early morning, bending over to reach, sometimes just sitting still , random  Relieving factors: pain patch, Aleve, Excedrin  PRECAUTIONS: Fall  RED FLAGS: None   WEIGHT BEARING RESTRICTIONS: No  FALLS:  Has patient fallen in last 6 months? No  LIVING ENVIRONMENT: Lives with: lives alone Lives in: House/apartment Stairs: Yes: Internal: 12 steps; on right going uplives on main level, doesn't need to go up steps  Has following equipment at home: Chiropractor, Environmental consultant - 4 wheeled, and shower chair  OCCUPATION: retired   PLOF: Independent with community mobility without device and Leisure: watches TV, reading, crossword puzzles, uses hand weights for exercise  PATIENT GOALS: reduce back pain, improve stability   NEXT MD VISIT: May 2025  OBJECTIVE:  Note: Objective measures were completed at Evaluation unless otherwise noted.  DIAGNOSTIC FINDINGS:  CT 03/28/24 IMPRESSION: 1. Thoracic spondylosis and degenerative disc disease, with bridging anterior interbody spurring all levels between T7 and T12, and non bridging anterior interbody spurring at T5-6. 2. Mild thoracic kyphosis and dextroconvex thoracic scoliosis. 3.  Aortic Atherosclerosis (ICD10-I70.0).  Coronary atherosclerosis. 4. Suspected cholelithiasis. 5. Prominence of stool throughout the colon compatible with constipation.  PATIENT SURVEYS:  03/07/24: Modified Oswestry 18/45  =40% disability  04/22/24   Modified Oswestry 9 / 50 = 18.0 %  COGNITION: Overall cognitive status: Within functional limits for tasks assessed     SENSATION: WFL   POSTURE: rounded shoulders, forward head, flexed trunk , and weight shift left  PALPATION: Palpable tenderness over Lt>Rt thoracic paraspinals, Lt rhomboids and lumbar paraspinals   LOWER EXTREMITY MMT:    Rt hip rests in ER  4/5, Lt hip 4+/5, bil knees 4+/5   FUNCTIONAL TESTS:  04/06/24: 5x sit to stand: 20.3 seconds with controlled descent  3 min walk test: 255 feet with 7/10 RPE  03/08/24: 5 times sit to stand: 20.02 seconds with uncontrolled descent 03/16/24: 3 min walk test 264 feet with 8/10 RPE  04/22/24 3 min walk test 390 ft with 8-9/10 RPE  5XSTS 19.16 sec with controlled descent  05/04/24: 5x sit to stand 20 seconds   06/01/24 3 min walk test 357 ft with 7/10 RPE  5XSTS 15.85 sec  with controlled descent  06/10/24  3 min walk 335 ft 6/10 1.86 ft/sec  06/15/24 5XSTS 11.89 sec  06/17/24  292 ft 5/10  06/24/24  275 ft in 2 min 17 sec  57ft/sec  06/28/24 410 ft in 3 min with RPE of 2/10  GAIT: Distance walked: 50 Assistive device utilized: Quad cane large base Level of assistance: Modified independence Comments: ER at Rt hip, flexed trunk  TREATMENT DATE:   06/28/24 NuStep: level 5x 10 minutes- PT student present to discuss progress Seated 8# hip to hip with , V's (shoulder/waist/shoulder) , 6# ear to ear: x15 Sit to stand 2x5  Dynadisc 2x10 reverse crunch 8# ab crunch in sitting x 10 Steam engines 4 # weights 2x20 Lat pull down yellow x 10 PT standing on mat Biceps curl  to [facility] press 4# 2x 10,  Gait: 410 ft in 3 min with RW Long arc quads x 20,  Seated marching  x 15   06/24/24:  NuStep: level 5x 10 minutes- PT present to discuss progress Sit to stand 1x10 with slower descent  Seated trunk ext and diagonals x 10 ea 6# wt and following with eyes  Seated 8# hip to hip with , V's  (shoulder/waist/shoulder) , 6# ear to ear: x10 8# ab crunch in sitting x 10 Steam engines 4 # weights 2x10 Lat pull down yellow x 10 PT standing on mat Shoulder ext yellow x 10 PT holding in front and OH Biceps curl  to [facility] press 4# 2x 10,  Gait: 275 ft in 2 min 17 sec  27ft/sec Long arc quads x 20, marching (no wt on R today) x 15and ER with 4# added to L ankle only x 10  HS curls 4# x 10 B  06/22/24: NuStep: level 5x 10 minutes- PT present to discuss progress Sit to stand 1x10, 1 x 10  slower descent  Seated L soleus stretch on leg of walker 2 x 30 sec Seated trunk ext and diagonals x 10 ea 5# kettlebell and following with eyes  Seated 8# hip to hip with , V's (shoulder/waist/shoulder) , 5# ear to ear: x10 8# ab crunch in sitting  Steam engines 4 # weights 2x10 Biceps curl  to [facility] press 4# 2x 10,  Seated horizontal ABD RTB  x 10 Seated ER with retraction with RTB x 10 Seated lateral raises , 3# 1 x 10 nad 1x 5 Long arc quads x 20, marching and ER with 4# added to ankles 2x10 HS curls 4# x 10 B    PATIENT EDUCATION:  Education details: [email or web address], HEP update Person educated: Patient Education method: Explanation, Demonstration, and Handouts Education comprehension: verbalized understanding and returned demonstration  HOME EXERCISE PROGRAM: Access Code: [email or web address] URL: [email or web address] Date: 03/23/2024 Prepared by: Burnard  Exercises - Supine Hip Internal and External Rotation  - 1 x daily - 7 x weekly - 3 sets - 10 reps - Seated Scapular Retraction  - 5 x daily - 7 x weekly - 1 sets - 10 reps - 5 hold - Seated Thoracic Flexion and Rotation with Arms Crossed  - 3 x daily - 7 x weekly - 1 sets - 3 reps - 10 hold - Seated Correct Posture  - 1 x daily - 7 x weekly - 3 sets - 10 reps - Correct Standing Posture  - 1 x daily - 7 x weekly - 3 sets - 10 reps - Seated Hamstring Stretch  - 3 x daily - 7 x weekly - 1 sets - 3 reps - 20 hold - Supine Lower Trunk Rotation  -  3 x daily - 7 x weekly - 1 sets - 3 reps - 20 hold - Seated Thoracic Extension Arms Overhead  - 1 x daily - 7 x weekly - 1-2 sets - 10 reps - 2-3 sec hold - Seated Trunk Rotation  - 1 x daily - 7 x weekly - 1-2 sets - 10 reps - 2-3 sec hold - Seated Diagonal Chops with Medicine Ball  - 1 x daily - 7 x weekly - 1-2 sets - 10 reps - 2-3 hold - Seated Bilateral Shoulder External Rotation with Resistance  - 1 x daily - 7 x weekly - 2 sets - 10 reps - Seated Shoulder Horizontal Abduction with Resistance  - 1 x daily - 7 x weekly - 2 sets - 10 reps ASSESSMENT:  CLINICAL IMPRESSION:   Pt reports to skilled PT with significant improvement with her functional capacity including gait time and sit to stands (please see above), since her initial evaluation. Pt has met all of her short term and long term goals, apart from one goal partially met. Pt  did well during today's session, able to complete every exercise without adverse reactions, even completing additional reps not asked of her. She states she feels her walking has improved so much since starting PT. We discussed her PT gains and pt agrees she's ready to discharge today. Pt demonstrated performing HEP and presents as one able to continue HEP independently.    OBJECTIVE IMPAIRMENTS: Abnormal gait, decreased activity tolerance, decreased balance, decreased mobility, difficulty walking, decreased strength, decreased safety awareness, hypomobility, increased muscle spasms, impaired flexibility, improper body mechanics, postural dysfunction, and pain.   ACTIVITY LIMITATIONS: carrying, lifting, standing, squatting, stairs, transfers, and locomotion level  PARTICIPATION LIMITATIONS: meal prep, cleaning, laundry, driving, community activity, and church  PERSONAL FACTORS: Age, Time since onset of injury/illness/exacerbation, and 1-2 comorbidities: Lt hip OA, chronic gait abnormality are also affecting patient's functional outcome.   REHAB POTENTIAL:  Good  CLINICAL DECISION MAKING: Evolving/moderate complexity  EVALUATION COMPLEXITY: Moderate   GOALS: Goals reviewed with patient? Yes  SHORT TERM GOALS: Target date: 05/25/2024      Be independent in initial HEP Baseline: Goal status: MET  2.  Ambulate 400 feet with 3 min walk test Baseline: 390 feet with RPE 8-9/10 (04/27/24) Goal status: NEW  3.  Report > or = to 60% reduction in LBP/thoracic pain with ADLs and self-care Baseline: 80% reduction in frequency (06/01/24) Goal status: MET  4.  Improve LE strength to perform stand to sit with controlled descent x 5 reps  Baseline: able to do this with controlled descent, on toe of Rt foot (04/06/24 Goal status: MET  5.  Report postural corrections at home to reduce lumbar/thoracic stress with standing Baseline: has been using walker to assist, still very challenged with this (05/04/24) Goal status: MET   LONG TERM GOALS: Target date: 06/29/2024      Be independent in advanced HEP Baseline:  Goal status: MET 06/28/24  2.  Ambulate 425 feet with 3 min walk test Baseline:310 feet Goal status: partially met  06/10/24 335 ft 6/10 06/28/24 410 ft in 3 min with RW  3.  Report > or = to 70% reduction in LBP/thoracic pain with ADLs and self-care Baseline: 80% reduction in frequency (06/01/24) Goal status: MET  4.  Perform 5x sit to stand in < or = to 15 seconds with min UE support to reduce falls risk Baseline: 15.85 seconds  (06/01/24) Goal status: MET 11.89 sec  5.  Improve endurance to perform 3 min walk test with RPE < or = to 5/10 Baseline: 8-9/10 Goal status: MET  25/25 335 ft 6/10 06/28/24 410 ft in 3 min 2/10 RPE   PLAN:  PHYSICAL THERAPY DISCHARGE SUMMARY  Visits from Start of Care: 28  Current functional level related to goals / functional outcomes: See above for current status.    Remaining deficits: Pt with fixed Rt hip contracture and is unable to achieve upright posture and alignment due to this.      Education / Equipment: Posture, HEP   Patient agrees to discharge. Patient goals were met. Patient is being discharged due to meeting the stated rehab goals. Burnard Joy, PT 06/29/24 12:43 PM   I agree with the following treatment note after reviewing documentation. This session was performed under the supervision of a licensed clinician.  Lavanda Cleverly, SPT 06/29/24 12:37 PM  [facility] [address] [place], [place] [address] Phone # [phone] Fax [phone]

## 2024-07-01 ENCOUNTER — Ambulatory Visit: Admitting: Physical Therapy

## 2024-07-05 DIAGNOSIS — H4311 Vitreous hemorrhage, right eye: Secondary | ICD-10-CM | POA: Diagnosis not present

## 2024-07-05 DIAGNOSIS — Z961 Presence of intraocular lens: Secondary | ICD-10-CM | POA: Diagnosis not present

## 2024-07-05 DIAGNOSIS — H33302 Unspecified retinal break, left eye: Secondary | ICD-10-CM | POA: Diagnosis not present

## 2024-07-06 ENCOUNTER — Encounter: Admitting: Physical Therapy

## 2024-07-26 DIAGNOSIS — D500 Iron deficiency anemia secondary to blood loss (chronic): Secondary | ICD-10-CM | POA: Diagnosis not present

## 2024-07-26 DIAGNOSIS — D649 Anemia, unspecified: Secondary | ICD-10-CM | POA: Diagnosis not present

## 2024-08-11 DIAGNOSIS — H401134 Primary open-angle glaucoma, bilateral, indeterminate stage: Secondary | ICD-10-CM | POA: Diagnosis not present

## 2024-09-13 DIAGNOSIS — Z961 Presence of intraocular lens: Secondary | ICD-10-CM | POA: Diagnosis not present

## 2024-09-13 DIAGNOSIS — H3581 Retinal edema: Secondary | ICD-10-CM | POA: Diagnosis not present

## 2024-09-13 DIAGNOSIS — H33302 Unspecified retinal break, left eye: Secondary | ICD-10-CM | POA: Diagnosis not present

## 2024-09-13 DIAGNOSIS — H4311 Vitreous hemorrhage, right eye: Secondary | ICD-10-CM | POA: Diagnosis not present

## 2024-09-23 DIAGNOSIS — D649 Anemia, unspecified: Secondary | ICD-10-CM | POA: Diagnosis not present

## 2024-09-23 DIAGNOSIS — N1831 Chronic kidney disease, stage 3a: Secondary | ICD-10-CM | POA: Diagnosis not present
